# Patient Record
Sex: Male | Born: 1985 | Race: Black or African American | Hispanic: No | Marital: Single | Smoking: Current some day smoker
Health system: Southern US, Community
[De-identification: ages and names within clinical notes are randomized; demographics above are authoritative.]

---
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
## 2008-03-25 ENCOUNTER — Emergency Department (HOSPITAL_COMMUNITY): Admission: EM | Admit: 2008-03-25 | Discharge: 2008-03-25 | Payer: Self-pay | Admitting: Emergency Medicine

## 2008-12-04 ENCOUNTER — Telehealth: Payer: Self-pay | Admitting: *Deleted

## 2009-10-27 ENCOUNTER — Emergency Department (HOSPITAL_COMMUNITY): Admission: EM | Admit: 2009-10-27 | Discharge: 2009-10-27 | Payer: Self-pay | Admitting: Emergency Medicine
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
## 2013-09-15 ENCOUNTER — Emergency Department (HOSPITAL_COMMUNITY)
Admission: EM | Admit: 2013-09-15 | Discharge: 2013-09-15 | Disposition: A | Discharge status: Home / Self Care | Payer: Self-pay | Attending: Emergency Medicine | Admitting: Emergency Medicine

## 2013-09-15 ENCOUNTER — Encounter (HOSPITAL_COMMUNITY): Payer: Self-pay | Admitting: Emergency Medicine

## 2013-09-15 DIAGNOSIS — R3915 Urgency of urination: Secondary | ICD-10-CM | POA: Insufficient documentation

## 2013-09-15 DIAGNOSIS — Z79899 Other long term (current) drug therapy: Secondary | ICD-10-CM | POA: Insufficient documentation

## 2013-09-15 DIAGNOSIS — R3919 Other difficulties with micturition: Secondary | ICD-10-CM | POA: Insufficient documentation

## 2013-09-15 DIAGNOSIS — H00019 Hordeolum externum unspecified eye, unspecified eyelid: Secondary | ICD-10-CM | POA: Insufficient documentation

## 2013-09-15 DIAGNOSIS — R300 Dysuria: Secondary | ICD-10-CM | POA: Insufficient documentation

## 2013-09-15 DIAGNOSIS — F17200 Nicotine dependence, unspecified, uncomplicated: Secondary | ICD-10-CM | POA: Insufficient documentation

## 2013-09-15 LAB — URINALYSIS, ROUTINE W REFLEX MICROSCOPIC
BILIRUBIN URINE: NEGATIVE
GLUCOSE, UA: NEGATIVE mg/dL
HGB URINE DIPSTICK: NEGATIVE
Ketones, ur: NEGATIVE mg/dL
Leukocytes, UA: NEGATIVE
NITRITE: NEGATIVE
Protein, ur: NEGATIVE mg/dL
Specific Gravity, Urine: 1.036 — ABNORMAL HIGH (ref 1.005–1.030)
UROBILINOGEN UA: 1 mg/dL (ref 0.0–1.0)
pH: 6 (ref 5.0–8.0)

## 2013-09-15 MED ORDER — PHENAZOPYRIDINE HCL 200 MG PO TABS: 200.0000 mg | ORAL_TABLET | Freq: Three times a day (TID) | ORAL | Status: DC

## 2013-09-15 NOTE — ED Provider Notes (Signed)
CSN: [id number]     Arrival date & time 09/15/13  1850 History   First MD Initiated Contact with Patient 09/15/13 2056     Chief Complaint  Patient presents with  . Abdominal Pain   (Consider location/radiation/quality/duration/timing/severity/associated sxs/prior Treatment) HPI Comments: Patient is a 28 year old male presenting to the emergency department for 3 days of suprapubic discomfort with associated dysuria and urinary urgency. Patient states his urine has also been in dark amber color but denies any hematuria. He denies any alleviating factors. The patient states he has been drinking mostly sodas, has not had much water in the last week or so. Patient denies any fevers, chills, nausea, vomiting, diarrhea, constipation, rectal pain, back or flank pain, chest pain, shortness of breath, penile discharge, penile or testicular swelling. Patient denies any history of urinary tract infections. He denies any abdominal surgical history.    History reviewed. No pertinent past medical history. History reviewed. No pertinent past surgical history. History reviewed. No pertinent family history. History  Substance Use Topics  . Smoking status: Current Some Day Smoker -- 0.50 packs/day    Types: Cigarettes  . Smokeless tobacco: Not on file  . Alcohol Use: No    Review of Systems  Constitutional: Negative for fever and chills.  Gastrointestinal: Positive for abdominal pain. Negative for nausea, vomiting, diarrhea, constipation, blood in stool, abdominal distention, anal bleeding and rectal pain.  Genitourinary: Positive for dysuria, urgency and decreased urine volume. Negative for frequency, hematuria, flank pain, discharge, penile swelling, scrotal swelling, enuresis, difficulty urinating, genital sores, penile pain and testicular pain.  Musculoskeletal: Negative for back pain.  All other systems reviewed and are negative.    Allergies  Review of patient's allergies indicates no known  allergies.  Home Medications   Current Outpatient Rx  Name  Route  Sig  Dispense  Refill  . phenazopyridine (PYRIDIUM) 200 MG tablet   Oral   Take 1 tablet (200 mg total) by mouth 3 (three) times daily.   6 tablet   0    BP 148/82  Pulse 70  Temp(Src) 98 F (36.7 C) (Oral)  Resp 19  SpO2 96% Physical Exam  Constitutional: He is oriented to person, place, and time. He appears well-developed and well-nourished. No distress.  HENT:  Head: Normocephalic and atraumatic.  Right Ear: External ear normal.  Left Ear: External ear normal.  Nose: Nose normal.  Mouth/Throat: Oropharynx is clear and moist. No oropharyngeal exudate.  Eyes: Conjunctivae are normal. Right eye exhibits hordeolum (no drainage. ).  Neck: Neck supple.  Cardiovascular: Normal rate, regular rhythm and normal heart sounds.   Pulmonary/Chest: Effort normal and breath sounds normal.  Abdominal: Soft. Bowel sounds are normal. He exhibits no distension. There is tenderness in the suprapubic area. There is no rigidity, no rebound, no guarding and no CVA tenderness.  Genitourinary: Testes normal and penis normal. Uncircumcised. No phimosis, paraphimosis, hypospadias, penile erythema or penile tenderness. No discharge found.  Musculoskeletal: Normal range of motion. He exhibits no edema.       Thoracic back: Normal.       Lumbar back: Normal.  Lymphadenopathy:       Right: No inguinal adenopathy present.       Left: No inguinal adenopathy present.  Neurological: He is alert and oriented to person, place, and time.  Skin: Skin is warm and dry. He is not diaphoretic.    ED Course  Procedures (including critical care time) Labs Review Labs Reviewed  URINALYSIS, ROUTINE W REFLEX  MICROSCOPIC - Abnormal; Notable for the following:    Color, Urine AMBER (*)    Specific Gravity, Urine 1.036 (*)    All other components within normal limits  URINE CULTURE  GC/CHLAMYDIA PROBE AMP   Imaging Review No results  found.  EKG Interpretation   None       MDM   1. Dysuria     Filed Vitals:   09/15/13 2323  BP:   Pulse:   Temp: 98 F (36.7 C)  Resp:     Patient initially denies any concerns for STIs, upon telling patient he does not have a urinary tract infection he admits that he has had unprotected sexual intercourse with his partner who was tested at [facility] and found to have a negative result, he would now like to be tested.   Afebrile, NAD, non-toxic appearing, AAOx4.  Abdomen soft, mildly tender in suprapubic region, no distention. No peritoneal signs. GU exam unremarkable. UA reviewed, no indications of infection. Will send culture and treat if culture returns positive. GC/Chlamydia sent will inform patient in 48-72 hours if results are positive and he should return for treatment, will not prophylactically treat now given history, physical, and UA results. Return precautions discussed. Patient agreeable to plan. Patient stable at time of discharge. Patient d/w with Dr. Silverio Lay, agrees with plan.        Jeannetta Ellis, PA-C 09/15/13 2352

## 2013-09-15 NOTE — Discharge Instructions (Signed)
Please follow up with your primary care physician in 1-2 days. If you do not have one please call the [facility] number listed above. Please take Pyridium to help with urinary symptoms. Please use warm compresses to stye to help. A culture has been sent to check your urine for an infection. Please drink water and avoid sodas and caffeinated beverages. Please read all discharge instructions and return precautions.    Dysuria Dysuria is the medical term for pain with urination. There are many causes for dysuria, but urinary tract infection is the most common. If a urinalysis was performed it can show that there is a urinary tract infection. A urine culture confirms that you or your child is sick. You will need to follow up with a healthcare provider because:  If a urine culture was done you will need to know the culture results and treatment recommendations.  If the urine culture was positive, you or your child will need to be put on antibiotics or know if the antibiotics prescribed are the right antibiotics for your urinary tract infection.  If the urine culture is negative (no urinary tract infection), then other causes may need to be explored or antibiotics need to be stopped. Today laboratory work may have been done and there does not seem to be an infection. If cultures were done they will take at least 24 to 48 hours to be completed. Today x-rays may have been taken and they read as normal. No cause can be found for the problems. The x-rays may be re-read by a radiologist and you will be contacted if additional findings are made. You or your child may have been put on medications to help with this problem until you can see your primary caregiver. If the problems get better, see your primary caregiver if the problems return. If you were given antibiotics (medications which kill germs), take all of the mediations as directed for the full course of treatment.  If laboratory work  was done, you need to find the results. Leave a telephone number where you can be reached. If this is not possible, make sure you find out how you are to get test results. HOME CARE INSTRUCTIONS   Drink lots of fluids. For adults, drink eight, 8 ounce glasses of clear juice or water a day. For children, replace fluids as suggested by your caregiver.  Empty the bladder often. Avoid holding urine for long periods of time.  After a bowel movement, women should cleanse front to back, using each tissue only once.  Empty your bladder before and after sexual intercourse.  Take all the medicine given to you until it is gone. You may feel better in a few days, but TAKE ALL MEDICINE.  Avoid caffeine, tea, alcohol and carbonated beverages, because they tend to irritate the bladder.  In men, alcohol may irritate the prostate.  Only take over-the-counter or prescription medicines for pain, discomfort, or fever as directed by your caregiver.  If your caregiver has given you a follow-up appointment, it is very important to keep that appointment. Not keeping the appointment could result in a chronic or permanent injury, pain, and disability. If there is any problem keeping the appointment, you must call back to this facility for assistance. SEEK IMMEDIATE MEDICAL CARE IF:   Back pain develops.  A fever develops.  There is nausea (feeling sick to your stomach) or vomiting (throwing up).  Problems are no better with medications or are getting worse.  MAKE SURE YOU:   Understand these instructions.  Will watch your condition.  Will get help right away if you are not doing well or get worse. Document Released: 05/06/2004 Document Revised: 10/31/2011 Document Reviewed: 03/13/2008 [facility] Patient Information 2014 [place], [place]. Sexually Transmitted Disease A sexually transmitted disease (STD) is a disease or infection that may be passed (transmitted) from person to person, usually during sexual  activity. This may happen by way of saliva, semen, blood, vaginal mucus, or urine. Common STDs include:  Gonorrhea.  Chlamydia.  Syphilis.  HIV and AIDS.  Genital herpes.  Hepatitis B and C.  Trichomonas.  Human papillomavirus (HPV).  Pubic lice.  Scabies. Mites. Bacterial vaginosis. WHAT ARE CAUSES OF STDs? An STD may be caused by bacteria, a virus, or parasites. STDs are often transmitted during sexual activity if one person is infected. However, they may also be transmitted through nonsexual means. STDs may be transmitted after:  Sexual intercourse with an infected person.  Sharing sex toys with an infected person.  Sharing needles with an infected person or using unclean piercing or tattoo needles. Having intimate contact with the genitals, mouth, or rectal areas of an infected person.  Exposure to infected fluids during birth. WHAT ARE THE SIGNS AND SYMPTOMS OF STDs? Different STDs have different symptoms. Some people may not have any symptoms. If symptoms are present, they may include:  Painful or bloody urination.  Pain in the pelvis, abdomen, vagina, anus, throat, or eyes.  Skin rash, itching, irritation, growths, sores (lesions), ulcerations, or warts in the genital or anal area. Abnormal vaginal discharge with or without bad odor.  Penile discharge in men.  Fever.  Pain or bleeding during sexual intercourse.  Swollen glands in the groin area.  Yellow skin and eyes (jaundice). This is seen with hepatitis.  Swollen testicles. Infertility. Sores and blisters in the mouth. HOW ARE STDs DIAGNOSED? To make a diagnosis, your health care provider may:  Take a medical history.  Perform a physical exam.  Take a sample of any discharge for examination. Swab the throat, cervix, opening to the penis, rectum, or vagina for testing. Test a sample of your first morning urine.  Perform blood tests.  Perform a Pap smear, if this applies.  Perform a  colposcopy.  Perform a laparoscopy.  HOW ARE STDs TREATED? Treatment depends on the STD. Some STDs may be treated but not cured.  Chlamydia, gonorrhea, trichomonas, and syphilis can be cured with antibiotics.  Genital herpes, hepatitis, and HIV can be treated, but not cured, with prescribed medicines. The medicines lessen symptoms.  Genital warts from HPV can be treated with medicine or by freezing, burning (electrocautery), or surgery. Warts may come back.  HPV cannot be cured with medicine or surgery. However, abnormal areas may be removed from the cervix, vagina, or vulva.  If your diagnosis is confirmed, your recent sexual partners need treatment. This is true even if they are symptom-free or have a negative culture or evaluation. They should not have sex until their health care providers say it is OK. HOW CAN I REDUCE MY RISK OF GETTING AN STD? Use latex condoms, dental dams, and water-soluble lubricants during sexual activity. Do not use petroleum jelly or oils. Get vaccinated for HPV and hepatitis. If you have not received these vaccines in the past, talk to your health care provider about whether one or both might be right for you.  Avoid risky sex practices that can break the skin.  WHAT SHOULD I DO  IF I THINK I HAVE AN STD? See your health care provider.  Inform all sexual partners. They should be tested and treated for any STDs. Do not have sex until your health care provider says it is OK. WHEN SHOULD I GET HELP? Seek immediate medical care if: You develop severe abdominal pain. You are a man and notice swelling or pain in the testicles. You are a woman and notice swelling or pain in your vagina. Document Released: 10/29/2002 Document Revised: 05/29/2013 Document Reviewed: 02/26/2013 [facility] Patient Information 2014 [place], [place].

## 2013-09-15 NOTE — ED Notes (Signed)
Bed: [id number] Expected date:  Expected time:  Means of arrival:  Comments: Margo AyeHall A

## 2013-09-15 NOTE — ED Notes (Signed)
Pt arrived to the ED with a complaint of lower abdominal pain that has caused urinary discomfort.  Pt states pain has started three days ago and feels like a squeezing sensation.  Pt states urine started out amber, changes to a lighter color.  Pt took UTI OTC medication and had discoloration and pain.

## 2013-09-16 LAB — GC/CHLAMYDIA PROBE AMP
CT Probe RNA: NEGATIVE
GC Probe RNA: NEGATIVE

## 2013-09-17 LAB — URINE CULTURE
COLONY COUNT: NO GROWTH
Culture: NO GROWTH

## 2013-09-18 NOTE — ED Provider Notes (Signed)
Medical screening examination/treatment/procedure(s) were performed by non-physician practitioner and as supervising physician I was immediately available for consultation/collaboration.  EKG Interpretation   None         Richardean Canalavid H Shalini Mair, MD 09/18/13 [phone]

## 2014-02-02 ENCOUNTER — Encounter (HOSPITAL_COMMUNITY): Payer: Self-pay | Admitting: Emergency Medicine

## 2014-02-02 ENCOUNTER — Emergency Department (INDEPENDENT_AMBULATORY_CARE_PROVIDER_SITE_OTHER)
Admission: EM | Admit: 2014-02-02 | Discharge: 2014-02-02 | Disposition: A | Discharge status: Home / Self Care | Payer: Self-pay | Source: Home / Self Care | Attending: Emergency Medicine | Admitting: Emergency Medicine

## 2014-02-02 DIAGNOSIS — J029 Acute pharyngitis, unspecified: Secondary | ICD-10-CM

## 2014-02-02 LAB — POCT RAPID STREP A: STREPTOCOCCUS, GROUP A SCREEN (DIRECT): NEGATIVE

## 2014-02-02 MED ORDER — GUAIFENESIN-CODEINE 100-10 MG/5ML PO SYRP: 5.0000 mL | ORAL_SOLUTION | Freq: Four times a day (QID) | ORAL | Status: DC | PRN

## 2014-02-02 MED ORDER — PREDNISONE 20 MG PO TABS: 20.0000 mg | ORAL_TABLET | Freq: Two times a day (BID) | ORAL | Status: DC

## 2014-02-02 MED ORDER — AMOXICILLIN 500 MG PO CAPS: 1000.0000 mg | ORAL_CAPSULE | Freq: Three times a day (TID) | ORAL | Status: DC

## 2014-02-02 NOTE — ED Provider Notes (Signed)
Chief Complaint   Chief Complaint  Patient presents with  . Sore Throat    History of Present Illness   Cory Torres is a 28 year old male who has had a ten-day history of sore throat, hoarseness, chills, nasal congestion, rhinorrhea, ear congestion, a swollen gland on the right side of his neck, cough productive of small amounts of yellow sputum, and chest pain. He's had no GI symptoms. He's had several family members who've had bronchitis. He denies any fever or shortness of breath.  Review of Systems   Other than as noted above, the patient denies any of the following symptoms: Systemic:  No fevers, chills, sweats, or myalgias. Eye:  No redness or discharge. ENT:  No ear pain, headache, nasal congestion, drainage, sinus pressure, or sore throat. Neck:  No neck pain, stiffness, or swollen glands. Lungs:  No cough, sputum production, hemoptysis, wheezing, chest tightness, shortness of breath or chest pain. GI:  No abdominal pain, nausea, vomiting or diarrhea.  PMFSH   Past medical history, family history, social history, meds, and allergies were reviewed.   Physical exam   Vital signs:  BP 147/93  Pulse 76  Temp(Src) 98.7 F (37.1 C) (Oral)  Resp 18  SpO2 99% General:  Alert and oriented.  In no distress.  Skin warm and dry. Eye:  No conjunctival injection or drainage. Lids were normal. ENT:  TMs and canals were normal, without erythema or inflammation.  Nasal mucosa was clear and uncongested, without drainage.  Mucous membranes were moist.  Pharynx was clear with no exudate or drainage.  There were no oral ulcerations or lesions. Neck:  Supple, no adenopathy, tenderness or mass. Lungs:  No respiratory distress.  Lungs were clear to auscultation, without wheezes, rales or rhonchi.  Breath sounds were clear and equal bilaterally.  Heart:  Regular rhythm, without gallops, murmers or rubs. Skin:  Clear, warm, and dry, without rash or lesions.  Labs   Results for orders  placed during the hospital encounter of 02/02/14  POCT RAPID STREP A ([facility]RG CARE ONLY)      Result Value Ref Range   Streptococcus, Group A Screen (Direct) NEGATIVE  NEGATIVE    Assessment     The encounter diagnosis was Pharyngitis.  Plan    1.  Meds:  The following meds were prescribed:   Discharge Medication List as of 02/02/2014 11:16 AM    START taking these medications   Details  amoxicillin (AMOXIL) 500 MG capsule Take 2 capsules (1,000 mg total) by mouth 3 (three) times daily., Starting 02/02/2014, Until Discontinued, Normal    guaiFENesin-codeine (GUIATUSS AC) 100-10 MG/5ML syrup Take 5 mLs by mouth 4 (four) times daily as needed for cough., Starting 02/02/2014, Until Discontinued, Print    predniSONE (DELTASONE) 20 MG tablet Take 1 tablet (20 mg total) by mouth 2 (two) times daily., Starting 02/02/2014, Until Discontinued, Normal        2.  Patient Education/Counseling:  The patient was given appropriate handouts, self care instructions, and instructed in symptomatic relief.  Instructed to get extra fluids, rest, and use a cool mist vaporizer.    3.  Follow up:  The patient was told to follow up here if no better in 3 to 4 days, or sooner if becoming worse in any way, and given some red flag symptoms such as increasing fever, difficulty breathing, chest pain, or persistent vomiting which would prompt immediate return.  Follow up here as needed.      Cory Huaavid  Vivia Budge Jadalynn Burr, MD 02/02/14 1332

## 2014-02-02 NOTE — ED Notes (Signed)
Patient complains of sore throat with head congestion and drainage that is yellow in color; states some fever off and on for past 9 days.

## 2014-02-02 NOTE — Discharge Instructions (Signed)

## 2014-02-04 LAB — CULTURE, GROUP A STREP

## 2014-05-08 ENCOUNTER — Encounter (HOSPITAL_COMMUNITY): Payer: Self-pay | Admitting: Emergency Medicine

## 2014-05-08 ENCOUNTER — Emergency Department (HOSPITAL_COMMUNITY)
Admission: EM | Admit: 2014-05-08 | Discharge: 2014-05-08 | Disposition: A | Discharge status: Home Health | Payer: Self-pay | Attending: Emergency Medicine | Admitting: Emergency Medicine

## 2014-05-08 DIAGNOSIS — R1084 Generalized abdominal pain: Secondary | ICD-10-CM | POA: Insufficient documentation

## 2014-05-08 DIAGNOSIS — Y939 Activity, unspecified: Secondary | ICD-10-CM | POA: Insufficient documentation

## 2014-05-08 DIAGNOSIS — Y929 Unspecified place or not applicable: Secondary | ICD-10-CM | POA: Insufficient documentation

## 2014-05-08 DIAGNOSIS — T628X1A Toxic effect of other specified noxious substances eaten as food, accidental (unintentional), initial encounter: Secondary | ICD-10-CM | POA: Insufficient documentation

## 2014-05-08 DIAGNOSIS — F17200 Nicotine dependence, unspecified, uncomplicated: Secondary | ICD-10-CM | POA: Insufficient documentation

## 2014-05-08 DIAGNOSIS — A059 Bacterial foodborne intoxication, unspecified: Secondary | ICD-10-CM

## 2014-05-08 DIAGNOSIS — R112 Nausea with vomiting, unspecified: Secondary | ICD-10-CM | POA: Insufficient documentation

## 2014-05-08 LAB — BASIC METABOLIC PANEL
ANION GAP: 13 (ref 5–15)
BUN: 16 mg/dL (ref 6–23)
CO2: 22 mEq/L (ref 19–32)
Calcium: 9.3 mg/dL (ref 8.4–10.5)
Chloride: 101 mEq/L (ref 96–112)
Creatinine, Ser: 0.9 mg/dL (ref 0.50–1.35)
GFR calc Af Amer: >90 mL/min (ref >90)
GLUCOSE: 103 mg/dL — AB (ref 70–99)
POTASSIUM: 4.1 meq/L (ref 3.7–5.3)
SODIUM: 136 meq/L — AB (ref 137–147)

## 2014-05-08 LAB — CBC
HCT: 45.3 % (ref 39.0–52.0)
Hemoglobin: 15.9 g/dL (ref 13.0–17.0)
MCH: 32 pg (ref 26.0–34.0)
MCHC: 35.1 g/dL (ref 30.0–36.0)
MCV: 91.1 fL (ref 78.0–100.0)
PLATELETS: 225 10*3/uL (ref 150–400)
RBC: 4.97 MIL/uL (ref 4.22–5.81)
RDW: 12.7 % (ref 11.5–15.5)
WBC: 6.1 10*3/uL (ref 4.0–10.5)

## 2014-05-08 LAB — URINE MICROSCOPIC-ADD ON

## 2014-05-08 LAB — URINALYSIS, ROUTINE W REFLEX MICROSCOPIC
Bilirubin Urine: NEGATIVE
GLUCOSE, UA: NEGATIVE mg/dL
Hgb urine dipstick: NEGATIVE
Ketones, ur: NEGATIVE mg/dL
LEUKOCYTES UA: NEGATIVE
Nitrite: NEGATIVE
PROTEIN: 30 mg/dL — AB
Specific Gravity, Urine: 1.038 — ABNORMAL HIGH (ref 1.005–1.030)
UROBILINOGEN UA: 4 mg/dL — AB (ref 0.0–1.0)
pH: 6.5 (ref 5.0–8.0)

## 2014-05-08 MED ORDER — ONDANSETRON HCL 4 MG/2ML IJ SOLN
4.0000 mg | Freq: Once | INTRAMUSCULAR | Status: AC
  Administered 2014-05-08: 4 mg via INTRAVENOUS
  Filled 2014-05-08: qty 2

## 2014-05-08 MED ORDER — DICYCLOMINE HCL 10 MG/ML IM SOLN
20.0000 mg | Freq: Once | INTRAMUSCULAR | Status: AC
  Administered 2014-05-08: 20 mg via INTRAMUSCULAR
  Filled 2014-05-08: qty 2

## 2014-05-08 MED ORDER — OMEPRAZOLE 20 MG PO CPDR: 20.0000 mg | DELAYED_RELEASE_CAPSULE | Freq: Every day | ORAL | Status: DC

## 2014-05-08 MED ORDER — SODIUM CHLORIDE 0.9 % IV BOLUS (SEPSIS)
500.0000 mL | Freq: Once | INTRAVENOUS | Status: AC
Start: 2014-05-08 — End: 2014-05-08
  Administered 2014-05-08: 500 mL via INTRAVENOUS

## 2014-05-08 MED ORDER — ONDANSETRON 8 MG PO TBDP: ORAL_TABLET | ORAL | Status: DC

## 2014-05-08 MED ORDER — KETOROLAC TROMETHAMINE 30 MG/ML IJ SOLN
30.0000 mg | Freq: Once | INTRAMUSCULAR | Status: AC
  Administered 2014-05-08: 30 mg via INTRAVENOUS
  Filled 2014-05-08: qty 1

## 2014-05-08 MED ORDER — DICYCLOMINE HCL 20 MG PO TABS: 20.0000 mg | ORAL_TABLET | Freq: Two times a day (BID) | ORAL | Status: DC

## 2014-05-08 NOTE — ED Notes (Signed)
Pt brought in via ems. Pt c/o right and left lower abd pain. Pt states pain is like a cramping sensation. No radiation of pain. No rebound tenderness. Pt states every am he has some abd pain. Pt became worse last night and woke him up out of his sleep.

## 2014-05-08 NOTE — ED Provider Notes (Signed)
CSN: [id number]     Arrival date & time 05/08/14  0203 History   First MD Initiated Contact with Patient 05/08/14 [phone]     Chief Complaint  Patient presents with  . Abdominal Pain     (Consider location/radiation/quality/duration/timing/severity/associated sxs/prior Treatment) Patient is a 28 y.o. male presenting with abdominal pain. The history is provided by the patient.  Abdominal Pain Pain location:  Generalized Pain quality: cramping   Pain radiates to:  Does not radiate Pain severity:  Severe Onset quality:  Sudden Timing:  Constant Progression:  Unchanged Chronicity:  New Context: eating and suspicious food intake   Context comment:  Burger and chili at [facility]  Relieved by:  Nothing Worsened by:  Nothing tried Ineffective treatments:  None tried Associated symptoms: nausea and vomiting   Risk factors: not pregnant     History reviewed. No pertinent past medical history. History reviewed. No pertinent past surgical history. History reviewed. No pertinent family history. History  Substance Use Topics  . Smoking status: Current Some Day Smoker -- 0.50 packs/day    Types: Cigarettes  . Smokeless tobacco: Not on file  . Alcohol Use: No    Review of Systems  Gastrointestinal: Positive for nausea, vomiting and abdominal pain.  All other systems reviewed and are negative.     Allergies  Review of patient's allergies indicates no known allergies.  Home Medications   Prior to Admission medications   Not on File   BP 157/91  Pulse 101  Temp(Src) 99.1 F (37.3 C) (Oral)  Resp 18  SpO2 94% Physical Exam  Constitutional: He is oriented to person, place, and time. He appears well-developed and well-nourished. No distress.  HENT:  Head: Normocephalic and atraumatic.  Mouth/Throat: Oropharynx is clear and moist. No oropharyngeal exudate.  Eyes: Conjunctivae and EOM are normal. Pupils are equal, round, and reactive to light.  Neck: Normal range of motion. Neck  supple.  Cardiovascular: Normal rate, regular rhythm and intact distal pulses.   Pulmonary/Chest: Effort normal and breath sounds normal. He has no wheezes. He has no rales.  Abdominal: Soft. Bowel sounds are increased. There is no tenderness. There is no rebound, no guarding, no tenderness at McBurney's point and negative Murphy's sign.  Musculoskeletal: Normal range of motion.  Neurological: He is alert and oriented to person, place, and time.  Skin: Skin is warm and dry.  Psychiatric: He has a normal mood and affect.    ED Course  Procedures (including critical care time) Labs Review Labs Reviewed  BASIC METABOLIC PANEL - Abnormal; Notable for the following:    Sodium 136 (*)    Glucose, Bld 103 (*)    All other components within normal limits  CBC  URINALYSIS, ROUTINE W REFLEX MICROSCOPIC    Imaging Review No results found.   EKG Interpretation None      MDM   Final diagnoses:  None    Likely the chili and hamburger from [facility] feels markedly improved will d/c with medication.  Return for worsening symptoms    Siddh Vandeventer K Geraldean Walen-Rasch, MD 05/08/14 [phone]

## 2014-05-08 NOTE — Discharge Instructions (Signed)
Food Poisoning °Food poisoning is an illness caused by something you ate or drank. It usually lasts 1 to 2 days. Problems may be worse for people with low immune systems, the elderly, children and infants, and pregnant women. °HOME CARE °· Drink enough water and fluids to keep your pee (urine) clear or pale yellow. Drink small amounts often. °· Ask your doctor how to replace body fluid losses (rehydration). °· Avoid: °¨ Foods high in sugar. °¨ Alcohol. °¨ Bubbly (carbonated) drinks. °¨ Tobacco. °¨ Juice. °¨ Caffeine drinks. °¨ Very hot or cold fluids. °¨ Fatty, greasy foods. °¨ Eating too much at one time. °¨ Dairy products until 24 to 48 hours after watery poop (diarrhea) stops. °· You may eat foods with active cultures (probiotics). They can be found in some yogurts and supplements. °· Wash your hands well to avoid spreading the illness. °· Only take medicines as told by your doctor. Do not give aspirin to children. °· Ask your doctor if you should keep taking your regular medicines. °GET HELP RIGHT AWAY IF:  °· You have trouble breathing, swallowing, talking, or moving. °· You have blurry vision. °· You cannot keep fluids down. °· You pass out (faint) or almost pass out. °· Your eyes turn yellow. °· You keep throwing up (vomiting) or having watery poop. °· Belly (abdominal) pain starts, gets worse, or is just in one small spot (localizes). °· You have a fever. °· Your watery poop has blood in it. °· You feel very weak, dizzy, or thirsty. °· You do not pee for 8 hours. °MAKE SURE YOU:  °· Understand these instructions. °· Will watch your condition. °· Will get help right away if you are not doing well or get worse. °Document Released: 01/26/2010 Document Revised: 10/31/2011 Document Reviewed: 12/24/2010 °[employer]® Patient Information ©2015 [employer]. This information is not intended to replace advice given to you by your health care provider. Make sure you discuss any questions you have with your health care  provider. ° °
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
## 2014-05-08 NOTE — ED Notes (Signed)
ems gave pt total of 100 mcg of fentanyl iv.

## 2014-05-08 NOTE — ED Notes (Signed)
Pt ambulates well without assistance.  

## 2014-11-13 ENCOUNTER — Emergency Department (HOSPITAL_COMMUNITY)
Admission: EM | Admit: 2014-11-13 | Discharge: 2014-11-13 | Disposition: A | Discharge status: Home / Self Care | Payer: Self-pay | Attending: Emergency Medicine | Admitting: Emergency Medicine

## 2014-11-13 ENCOUNTER — Encounter (HOSPITAL_COMMUNITY): Payer: Self-pay | Admitting: *Deleted

## 2014-11-13 DIAGNOSIS — Z79899 Other long term (current) drug therapy: Secondary | ICD-10-CM | POA: Insufficient documentation

## 2014-11-13 DIAGNOSIS — L02511 Cutaneous abscess of right hand: Secondary | ICD-10-CM | POA: Insufficient documentation

## 2014-11-13 DIAGNOSIS — Z720 Tobacco use: Secondary | ICD-10-CM | POA: Insufficient documentation

## 2014-11-13 MED ORDER — IBUPROFEN 400 MG PO TABS
600.0000 mg | ORAL_TABLET | Freq: Once | ORAL | Status: AC
  Administered 2014-11-13: 600 mg via ORAL
  Filled 2014-11-13: qty 2

## 2014-11-13 MED ORDER — IBUPROFEN 600 MG PO TABS: 600.0000 mg | ORAL_TABLET | Freq: Three times a day (TID) | ORAL | Status: DC

## 2014-11-13 MED ORDER — LIDOCAINE HCL (PF) 1 % IJ SOLN
2.0000 mL | Freq: Once | INTRAMUSCULAR | Status: DC
Start: 2014-11-13 — End: 2014-11-13
  Filled 2014-11-13: qty 5

## 2014-11-13 MED ORDER — HYDROCODONE-ACETAMINOPHEN 5-325 MG PO TABS: 1.0000 | ORAL_TABLET | ORAL | Status: DC | PRN

## 2014-11-13 NOTE — ED Notes (Addendum)
Pt went to [facility]  Monday 11-10-14 for finger abscess. Pt is currently taking clindamycin for the infection of finger.

## 2014-11-13 NOTE — ED Provider Notes (Signed)
CSN: [id number]     Arrival date & time 11/13/14  1345 History  This chart was scribed for non-physician practitioner, Earley FavorGail Pacen Watford, NP, working with Purvis SheffieldForrest Harrison, MD by Charline BillsEssence Howell, ED Scribe. This patient was seen in room TR08C/TR08C and the patient's care was started at 2:26 PM.   Chief Complaint  Patient presents with  . Abscess   The history is provided by the patient. No language interpreter was used.   HPI Comments: Cory Torres is a 29 y.o. male who presents to the Emergency Department complaining of a tender abscess to his R middle finger for the past few days. [facility] for the same. Pt had the abscess lanced and was discharged with Clindamycin. He returns today for persistent pain that is exacerbated with movement and swelling to his finger. No known allergies.  No PCP.   History reviewed. No pertinent past medical history. History reviewed. No pertinent past surgical history. History reviewed. No pertinent family history. History  Substance Use Topics  . Smoking status: Current Some Day Smoker -- 0.50 packs/day    Types: Cigarettes  . Smokeless tobacco: Not on file  . Alcohol Use: No    Review of Systems  Musculoskeletal: Positive for joint swelling.  Skin:       + Abscess   Allergies  Review of patient's allergies indicates no known allergies.  Home Medications   Prior to Admission medications   Medication Sig Start Date End Date Taking? Authorizing Provider  dicyclomine (BENTYL) 20 MG tablet Take 1 tablet (20 mg total) by mouth 2 (two) times daily. 05/08/14   April Palumbo, MD  omeprazole (PRILOSEC) 20 MG capsule Take 1 capsule (20 mg total) by mouth daily. 05/08/14   April Palumbo, MD  ondansetron [facility](ZOFRAN ODT) 8 MG disintegrating tablet 8mg  ODT q8 hours prn nausea 05/08/14   April Palumbo, MD   BP 135/90 mmHg  Pulse 80  Temp(Src) 98.5 F (36.9 C)  Resp 16  Ht 5\' 7"  (1.702 m)  Wt 265 lb (120.203 kg)  BMI 41.50 kg/m2  SpO2  100% Physical Exam  Constitutional: He is oriented to person, place, and time. He appears well-developed and well-nourished. No distress.  HENT:  Head: Normocephalic and atraumatic.  Eyes: Conjunctivae and EOM are normal.  Neck: Neck supple. No tracheal deviation present.  Cardiovascular: Normal rate.   Pulmonary/Chest: Effort normal. No respiratory distress.  Musculoskeletal: Normal range of motion.  R middle finger: Fluctuance between PIP and MCP joint on palmar surface with previous puncture/ I&D wound visible without drainage. Good capillary refill distally.  Neurological: He is alert and oriented to person, place, and time.  Skin: Skin is warm and dry.  Psychiatric: He has a normal mood and affect. His behavior is normal.  Nursing note and vitals reviewed.  ED Course  Procedures (including critical care time) DIAGNOSTIC STUDIES: Oxygen Saturation is 100% on RA, normal by my interpretation.    COORDINATION OF CARE: 2:27 PM-Discussed treatment plan which includes I&D with pt at bedside and pt agreed to plan.   INCISION AND DRAINAGE PROCEDURE NOTE: Patient identification was confirmed and verbal consent was obtained. This procedure was performed by Earley FavorGail Ennis Heavner, NP at 3:50 PM. Site: R middle finger Sterile procedures observed Needle size: 27 Anesthetic used (type and amt): 2 cc of 1% lidocaine  Blade size: 11 Drainage: copious, purulent  Complexity: Complex Site anesthetized, incision made over site, wound drained and explored loculations, rinsed with copious amounts of  normal saline, wound packed with sterile gauze, covered with dry, sterile dressing.  Pt tolerated procedure well without complications. Instructions for care discussed verbally and pt provided with additional written instructions for homecare and f/u.  Labs Review Labs Reviewed - No data to display  Imaging Review No results found.  EKG Interpretation None     Patient's finger was drained.  Copious  amount of purulent drainage was instructed to soak his hand couple times a day for the next 2 days in a warm saline  Apply small amount of antibiotic ointment to the area twice a day for 3 days.  He's also been referred to hand surgery if he has recurrent problem, as is the second time this same area has been drained MDM   Final diagnoses:  None    I personally performed the services described in this documentation, which was scribed in my presence. The recorded information has been reviewed and is accurate.   Earley Favor, NP 11/13/14 1621  Purvis Sheffield, MD 11/14/14 1728

## 2014-11-13 NOTE — Discharge Instructions (Signed)
Abscess Care After An abscess (also called a boil or furuncle) is an infected area that contains a collection of pus. Signs and symptoms of an abscess include pain, tenderness, redness, or hardness, or you may feel a moveable soft area under your skin. An abscess can occur anywhere in the body. The infection may spread to surrounding tissues causing cellulitis. A cut (incision) by the surgeon was made over your abscess and the pus was drained out. Gauze may have been packed into the space to provide a drain that will allow the cavity to heal from the inside outwards. The boil may be painful for 5 to 7 days. Most people with a boil do not have high fevers. Your abscess, if seen early, may not have localized, and may not have been lanced. If not, another appointment may be required for this if it does not get better on its own or with medications. HOME CARE INSTRUCTIONS   Only take over-the-counter or prescription medicines for pain, discomfort, or fever as directed by your caregiver.  When you bathe, soak and then remove gauze or iodoform packs at least daily or as directed by your caregiver. You may then wash the wound gently with mild soapy water. Repack with gauze or do as your caregiver directs. SEEK IMMEDIATE MEDICAL CARE IF:   You develop increased pain, swelling, redness, drainage, or bleeding in the wound site.  You develop signs of generalized infection including muscle aches, chills, fever, or a general ill feeling.  An oral temperature above 102 F (38.9 C) develops, not controlled by medication. See your caregiver for a recheck if you develop any of the symptoms described above. If medications (antibiotics) were prescribed, take them as directed. Document Released: 02/24/2005 Document Revised: 10/31/2011 Document Reviewed: 10/22/2007 [facility] Patient Information 2015 [place]. This information is not intended to replace advice given to you by your health care provider. Make sure  you discuss any questions you have with your health care provider. Your abscess has been drained.  Please soak your hand in warm saline bath 3 or 4 times a day today and tomorrow.  Take ibuprofen on a regular basis.  Please move your hand as much as possible.  This will help with swelling as well as keeping it elevated.  I've also referred you to the hand surgeon if you continue to have pain and swelling at the site

## 2015-03-19 ENCOUNTER — Emergency Department (HOSPITAL_COMMUNITY)
Admission: EM | Admit: 2015-03-19 | Discharge: 2015-03-19 | Disposition: A | Discharge status: Home / Self Care | Payer: Self-pay | Attending: Emergency Medicine | Admitting: Emergency Medicine

## 2015-03-19 ENCOUNTER — Encounter (HOSPITAL_COMMUNITY): Payer: Self-pay | Admitting: Emergency Medicine

## 2015-03-19 DIAGNOSIS — Z720 Tobacco use: Secondary | ICD-10-CM | POA: Insufficient documentation

## 2015-03-19 DIAGNOSIS — Z791 Long term (current) use of non-steroidal anti-inflammatories (NSAID): Secondary | ICD-10-CM | POA: Insufficient documentation

## 2015-03-19 DIAGNOSIS — R61 Generalized hyperhidrosis: Secondary | ICD-10-CM | POA: Insufficient documentation

## 2015-03-19 DIAGNOSIS — Z79899 Other long term (current) drug therapy: Secondary | ICD-10-CM | POA: Insufficient documentation

## 2015-03-19 DIAGNOSIS — L02511 Cutaneous abscess of right hand: Secondary | ICD-10-CM | POA: Insufficient documentation

## 2015-03-19 MED ORDER — SULFAMETHOXAZOLE-TRIMETHOPRIM 800-160 MG PO TABS: 2.0000 | ORAL_TABLET | Freq: Two times a day (BID) | ORAL | Status: AC

## 2015-03-19 NOTE — ED Provider Notes (Signed)
CSN: [id number]     Arrival date & time 03/19/15  1027 History  This chart was scribed for non-physician practitioner, Santiago Glad, PA-C working with Nelva Nay, MD by Gwenyth Ober, ED scribe. This patient was seen in room TR05C/TR05C and the patient's care was started at 11:01 AM   Chief Complaint  Patient presents with  . foreign object in finger    The history is provided by the patient. No language interpreter was used.   HPI Comments: Cory Torres is a 29 y.o. male who presents to the Emergency Department complaining of a recurrent, gradually worsening area of swelling on his right middle finger that started 4 months ago, improved and then became worse in the last few weeks. Pt states orange purulent drainage, diaphoresis and a sharp, mild pain within the wound as associated symptoms. He tried warm water soaks and shaving and draining the wound with no relief. He also tried OTC wart remover which caused worsening swelling of the area. Pt has been evaluated by Urgent Care twice for the same who prescribed Clindamycin and performed I&D. He was last seen in the ED for the same on 3/24, was diagnosed with an abscess and was treated with I&D. Pt denies fever and chills as associated symptoms.  History reviewed. No pertinent past medical history. History reviewed. No pertinent past surgical history. No family history on file. History  Substance Use Topics  . Smoking status: Current Some Day Smoker -- 0.50 packs/day    Types: Cigarettes  . Smokeless tobacco: Not on file  . Alcohol Use: No    Review of Systems  Constitutional: Positive for diaphoresis. Negative for fever and chills.  Skin: Positive for wound.    Allergies  Review of patient's allergies indicates no known allergies.  Home Medications   Prior to Admission medications   Medication Sig Start Date End Date Taking? Authorizing Provider  dicyclomine (BENTYL) 20 MG tablet Take 1 tablet (20 mg total) by mouth 2  (two) times daily. 05/08/14   April Palumbo, MD  HYDROcodone-acetaminophen (NORCO/VICODIN) 5-325 MG per tablet Take 1 tablet by mouth every 4 (four) hours as needed. 11/13/14   Earley Favor, NP  ibuprofen (ADVIL,MOTRIN) 600 MG tablet Take 1 tablet (600 mg total) by mouth 3 (three) times daily. 11/13/14   Earley Favor, NP  omeprazole (PRILOSEC) 20 MG capsule Take 1 capsule (20 mg total) by mouth daily. 05/08/14   April Palumbo, MD  ondansetron [facility] ODT) 8 MG disintegrating tablet 8mg  ODT q8 hours prn nausea 05/08/14   April Palumbo, MD   BP 147/105 mmHg  Pulse 65  Temp(Src) 98.4 F (36.9 C) (Oral)  Resp 22  SpO2 100% Physical Exam  Constitutional: He appears well-developed and well-nourished. No distress.  HENT:  Head: Normocephalic and atraumatic.  Eyes: Conjunctivae and EOM are normal.  Neck: Neck supple. No tracheal deviation present.  Cardiovascular: Normal rate, regular rhythm and normal heart sounds.   Normal cap refill of right middle finger  Pulmonary/Chest: Effort normal and breath sounds normal. No respiratory distress.  Musculoskeletal:  Full ROM of the MCP, PIP and DIP of the right middle finger  Neurological:  Good sensation of right middle finger  Skin: Skin is warm and dry. No erythema.  1.5 cm hyperpigmented raised area of the palmar aspect of the right middle finger in between the MCP and PIP; no drainage; no fluctuance; no surrounding erythema, edema or warmth  Psychiatric: He has a normal mood and affect. His behavior is  normal.  Nursing note and vitals reviewed.   ED Course  Procedures   DIAGNOSTIC STUDIES: Oxygen Saturation is 100% on RA, normal by my interpretation.    COORDINATION OF CARE: 11:02 AM Discussed treatment plan with pt at bedside which includes antibiotic treatment. Pt agreed to plan.  Labs Review Labs Reviewed - No data to display  Imaging Review No results found.   EKG Interpretation None      MDM   Final diagnoses:  None  Patient  presents today with a raised area of his right middle finger.  No signs of infection at this time.  However, patient reports that he has noticed some drainage of the area.  Patient states that he does not want the area incised.  Area evaluated with bedside ultrasound.  No signs of fluid collection.  Patient neurovascularly intact.  Patient stable for discharge.  Due to the fact that this is recurrent will refer to Hand Surgery.  Return precautions given.    I personally performed the services described in this documentation, which was scribed in my presence. The recorded information has been reviewed and is accurate.    Santiago Glad, PA-C 03/20/15 2006  Nelva Nay, MD 03/21/15 [phone]

## 2015-03-19 NOTE — ED Notes (Signed)
Patient states he has been seen multiple times for the same thing.   Patient states he has "something in my middle finger on the R hand".   Patient states he "shaved off the top and squeezed it yesterday and got a lot of pus out".   Patient states he "has something sharp in there, I can feel it".   Denies pain.

## 2015-05-19 ENCOUNTER — Emergency Department (HOSPITAL_COMMUNITY)
Admission: EM | Admit: 2015-05-19 | Discharge: 2015-05-19 | Disposition: A | Discharge status: Home / Self Care | Payer: Self-pay | Attending: Emergency Medicine | Admitting: Emergency Medicine

## 2015-05-19 ENCOUNTER — Encounter (HOSPITAL_COMMUNITY): Payer: Self-pay | Admitting: Emergency Medicine

## 2015-05-19 DIAGNOSIS — L02511 Cutaneous abscess of right hand: Secondary | ICD-10-CM

## 2015-05-19 DIAGNOSIS — L03011 Cellulitis of right finger: Secondary | ICD-10-CM | POA: Insufficient documentation

## 2015-05-19 DIAGNOSIS — Z720 Tobacco use: Secondary | ICD-10-CM | POA: Insufficient documentation

## 2015-05-19 DIAGNOSIS — Z791 Long term (current) use of non-steroidal anti-inflammatories (NSAID): Secondary | ICD-10-CM | POA: Insufficient documentation

## 2015-05-19 DIAGNOSIS — Z79899 Other long term (current) drug therapy: Secondary | ICD-10-CM | POA: Insufficient documentation

## 2015-05-19 DIAGNOSIS — Z792 Long term (current) use of antibiotics: Secondary | ICD-10-CM | POA: Insufficient documentation

## 2015-05-19 MED ORDER — HYDROCODONE-ACETAMINOPHEN 5-325 MG PO TABS: 1.0000 | ORAL_TABLET | Freq: Four times a day (QID) | ORAL | Status: DC | PRN

## 2015-05-19 MED ORDER — LIDOCAINE HCL 2 % IJ SOLN
15.0000 mL | Freq: Once | INTRAMUSCULAR | Status: AC
  Administered 2015-05-19: 10 mg
  Filled 2015-05-19: qty 20

## 2015-05-19 MED ORDER — CEPHALEXIN 500 MG PO CAPS: 500.0000 mg | ORAL_CAPSULE | Freq: Two times a day (BID) | ORAL | Status: DC

## 2015-05-19 NOTE — ED Provider Notes (Signed)
CSN: [id number]     Arrival date & time 05/19/15  4010 History  By signing my name below, I, Tanda Rockers, attest that this documentation has been prepared under the direction and in the presence of Marlon Pel, PA-C. Electronically Signed: Tanda Rockers, ED Scribe. 05/19/2015. 10:26 AM.  Chief Complaint  Patient presents with  . Hand Problem   The history is provided by the patient. No language interpreter was used.     HPI Comments: Cory Torres is a 29 y.o. male with hx recurrent middle finger infection who presents to the Emergency Department complaining of gradual onset, constant, 10/10, right middle pain and swelling x 3 days. Pt reports similar symptoms twice in the past few months, and was seen at [facility] and the ED. Pt had I&D done last time but states no pus return. Pt soaked the finger 2 days after the I&D and had his father cut his finger with a razor and notes draining the finger himself. He did attempt to follow up with a hand specialist at that time but states it was too expensive. Pt mentions taking antibiotics the last two times which resolved his symptoms at the time. He denies finger, chills, or any other associated symptoms.     History reviewed. No pertinent past medical history. History reviewed. No pertinent past surgical history. No family history on file. Social History  Substance Use Topics  . Smoking status: Current Some Day Smoker -- 0.50 packs/day    Types: Cigarettes  . Smokeless tobacco: None  . Alcohol Use: No    Review of Systems  Constitutional: Negative for fever and chills.  Musculoskeletal: Positive for joint swelling and arthralgias (Right middle finger pain).  All other systems reviewed and are negative.  Allergies  Review of patient's allergies indicates no known allergies.  Home Medications   Prior to Admission medications   Medication Sig Start Date End Date Taking? Authorizing Provider  cephALEXin (KEFLEX) 500 MG capsule Take 1  capsule (500 mg total) by mouth 2 (two) times daily. 05/19/15   Tiffany Neva Seat, PA-C  dicyclomine (BENTYL) 20 MG tablet Take 1 tablet (20 mg total) by mouth 2 (two) times daily. 05/08/14   April Palumbo, MD  HYDROcodone-acetaminophen (NORCO/VICODIN) 5-325 MG per tablet Take 1 tablet by mouth every 6 (six) hours as needed. 05/19/15   Tiffany Neva Seat, PA-C  ibuprofen (ADVIL,MOTRIN) 600 MG tablet Take 1 tablet (600 mg total) by mouth 3 (three) times daily. 11/13/14   Earley Favor, NP  omeprazole (PRILOSEC) 20 MG capsule Take 1 capsule (20 mg total) by mouth daily. 05/08/14   April Palumbo, MD  ondansetron [facility] ODT) 8 MG disintegrating tablet  ODT q8 hours prn nausea 05/08/14   April Palumbo, MD   Triage Vitals: BP 149/95 mmHg  Pulse 79  Temp(Src) 97.9 F (36.6 C) (Oral)  Resp 16  Ht  (1.753 m)  Wt 260 lb (117.935 kg)  BMI 38.38 kg/m2  SpO2 100%   Physical Exam  Constitutional: He is oriented to person, place, and time. He appears well-developed and well-nourished. No distress.  HENT:  Head: Normocephalic and atraumatic.  Eyes: Conjunctivae and EOM are normal.  Neck: Neck supple. No tracheal deviation present.  Cardiovascular: Normal rate.   Pulmonary/Chest: Effort normal. No respiratory distress.  Musculoskeletal: Normal range of motion.  Swelling to the proximal phalanx with tenderness. FROM, no pain distally or proximally. CR < 2 seconds  Neurological: He is alert and oriented to person, place, and time.  Skin: Skin is warm and dry.  Psychiatric: He has a normal mood and affect. His behavior is normal.  Nursing note and vitals reviewed.   ED Course  Procedures (including critical care time)  DIAGNOSTIC STUDIES: Oxygen Saturation is 100% on RA, normal by my interpretation.    COORDINATION OF CARE: 10:23 AM-Discussed treatment plan which includes I&D, RX antibiotics, and referral to hand specialst with pt at bedside and pt agreed to plan.   Labs Review Labs Reviewed - No  data to display  Imaging Review No results found.    EKG Interpretation None      MDM   Final diagnoses:  Felon, right   NERVE BLOCK Performed by: Dorthula Matas Consent: Verbal consent obtained. Required items: required blood products, implants, devices, and special equipment available Time out: Immediately prior to procedure a "time out" was called to verify the correct patient, procedure, equipment, support staff and site/side marked as required.  Indication: middle finger right hand Nerve block body site: base of middle finger  Preparation: Patient was prepped and draped in the usual sterile fashion. Needle gauge: 24 G Location technique: anatomical landmarks  Local anesthetic: lidocaine  Anesthetic total: 3 ml  Outcome: pain improved Patient tolerance: Patient tolerated the procedure well with no immediate complications.   INCISION AND DRAINAGE Performed by: Dorthula Matas Consent: Verbal consent obtained. Risks and benefits: risks, benefits and alternatives were discussed Type: abscess  Body area: R middle finger  Complexity: complex Blunt dissection to break up loculations  Drainage: purulent  Drainage amount: large amount of purulent discharge.  Patient tolerance: Patient tolerated the procedure well with no immediate complications.  Rx: Keflex and Ultram, pt advised that he needs to see the hands surgeon or this abscess is likely to reoccur again until the core is removed.   Medications  lidocaine (XYLOCAINE) 2 % (with pres) injection 300 mg (10 mg Other Given 05/19/15 1037)    29 y.o.Cory Torres's evaluation in the Emergency Department is complete. It has been determined that no acute conditions requiring further emergency intervention are present at this time. The patient/guardian have been advised of the diagnosis and plan. We have discussed signs and symptoms that warrant return to the ED, such as changes or worsening in symptoms.  Vital  signs are stable at discharge. Filed Vitals:   05/19/15 0932  BP: 149/95  Pulse: 79  Temp: 97.9 F (36.6 C)  Resp: 16    Patient/guardian has voiced understanding and agreed to follow-up with the PCP or specialist.  I personally performed the services described in this documentation, which was scribed in my presence. The recorded information has been reviewed and is accurate.   Marlon Pel, PA-C 05/19/15 1111  Laurence Spates, MD 05/20/15 [phone]

## 2015-05-19 NOTE — ED Notes (Signed)
Pt has had recurrent right middle finger infection since May. States it has been "opened" twice, given abx with improvement. Started again 3 days ago. Right middle finger swollen.

## 2015-05-19 NOTE — Discharge Instructions (Signed)
Fingertip Infection °When an infection is around the nail, it is called a paronychia. When it appears over the tip of the finger, it is called a felon. These infections are due to minor injuries or cracks in the skin. If they are not treated properly, they can lead to bone infection and permanent damage to the fingernail. °Incision and drainage is necessary if a pus pocket (an abscess) has formed. Antibiotics and pain medicine may also be needed. Keep your hand elevated for the next 2-3 days to reduce swelling and pain. If a pack was placed in the abscess, it should be removed in 1-2 days by your caregiver. Soak the finger in warm water for 20 minutes 4 times daily to help promote drainage. °Keep the hands as dry as possible. Wear protective gloves with cotton liners. See your caregiver for follow-up care as recommended.  °HOME CARE INSTRUCTIONS  °· Keep wound clean, dry and dressed as suggested by your caregiver. °· Soak in warm salt water for fifteen minutes, four times per day for bacterial infections. °· Your caregiver will prescribe an antibiotic if a bacterial infection is suspected. Take antibiotics as directed and finish the prescription, even if the problem appears to be improving before the medicine is gone. °· Only take over-the-counter or prescription medicines for pain, discomfort, or fever as directed by your caregiver. °SEEK IMMEDIATE MEDICAL CARE IF: °· There is redness, swelling, or increasing pain in the wound. °· Pus or any other unusual drainage is coming from the wound. °· An unexplained oral temperature above 102° F (38.9° C) develops. °· You notice a foul smell coming from the wound or dressing. °MAKE SURE YOU:  °· Understand these instructions. °· Monitor your condition. °· Contact your caregiver if you are getting worse or not improving. °Document Released: 09/15/2004 Document Revised: 10/31/2011 Document Reviewed: 09/11/2008 °Exit[employer]® Patient Information ©2015 [employer]. This  information is not intended to replace advice given to you by your health care provider. Make sure you discuss any questions you have with your health care provider. ° °

## 2015-07-30 ENCOUNTER — Encounter (HOSPITAL_COMMUNITY): Payer: Self-pay | Admitting: Emergency Medicine

## 2015-07-30 ENCOUNTER — Emergency Department (HOSPITAL_COMMUNITY)
Admission: EM | Admit: 2015-07-30 | Discharge: 2015-07-30 | Disposition: A | Discharge status: Home / Self Care | Payer: Self-pay | Attending: Emergency Medicine | Admitting: Emergency Medicine

## 2015-07-30 DIAGNOSIS — Z791 Long term (current) use of non-steroidal anti-inflammatories (NSAID): Secondary | ICD-10-CM | POA: Insufficient documentation

## 2015-07-30 DIAGNOSIS — R197 Diarrhea, unspecified: Secondary | ICD-10-CM | POA: Insufficient documentation

## 2015-07-30 DIAGNOSIS — R112 Nausea with vomiting, unspecified: Secondary | ICD-10-CM | POA: Insufficient documentation

## 2015-07-30 DIAGNOSIS — F17210 Nicotine dependence, cigarettes, uncomplicated: Secondary | ICD-10-CM | POA: Insufficient documentation

## 2015-07-30 DIAGNOSIS — Z792 Long term (current) use of antibiotics: Secondary | ICD-10-CM | POA: Insufficient documentation

## 2015-07-30 DIAGNOSIS — Z79899 Other long term (current) drug therapy: Secondary | ICD-10-CM | POA: Insufficient documentation

## 2015-07-30 LAB — COMPREHENSIVE METABOLIC PANEL
ALBUMIN: 4.1 g/dL (ref 3.5–5.0)
ALK PHOS: 38 U/L (ref 38–126)
ALT: 30 U/L (ref 17–63)
ANION GAP: 6 (ref 5–15)
AST: 22 U/L (ref 15–41)
BUN: 15 mg/dL (ref 6–20)
CALCIUM: 8.4 mg/dL — AB (ref 8.9–10.3)
CO2: 26 mmol/L (ref 22–32)
Chloride: 106 mmol/L (ref 101–111)
Creatinine, Ser: 0.87 mg/dL (ref 0.61–1.24)
GFR calc Af Amer: >60 mL/min (ref >60)
GFR calc non Af Amer: >60 mL/min (ref >60)
GLUCOSE: 94 mg/dL (ref 65–99)
POTASSIUM: 3.8 mmol/L (ref 3.5–5.1)
SODIUM: 138 mmol/L (ref 135–145)
Total Bilirubin: 0.8 mg/dL (ref 0.3–1.2)
Total Protein: 7 g/dL (ref 6.5–8.1)

## 2015-07-30 LAB — URINALYSIS, ROUTINE W REFLEX MICROSCOPIC
Bilirubin Urine: NEGATIVE
Glucose, UA: NEGATIVE mg/dL
HGB URINE DIPSTICK: NEGATIVE
Ketones, ur: NEGATIVE mg/dL
Leukocytes, UA: NEGATIVE
Nitrite: NEGATIVE
Protein, ur: NEGATIVE mg/dL
SPECIFIC GRAVITY, URINE: 1.025 (ref 1.005–1.030)
pH: 6 (ref 5.0–8.0)

## 2015-07-30 LAB — CBC
HEMATOCRIT: 45.3 % (ref 39.0–52.0)
HEMOGLOBIN: 15.7 g/dL (ref 13.0–17.0)
MCH: 32 pg (ref 26.0–34.0)
MCHC: 34.7 g/dL (ref 30.0–36.0)
MCV: 92.4 fL (ref 78.0–100.0)
Platelets: 223 10*3/uL (ref 150–400)
RBC: 4.9 MIL/uL (ref 4.22–5.81)
RDW: 12.8 % (ref 11.5–15.5)
WBC: 8.1 10*3/uL (ref 4.0–10.5)

## 2015-07-30 LAB — LIPASE, BLOOD: Lipase: 35 U/L (ref 11–51)

## 2015-07-30 MED ORDER — LOPERAMIDE HCL 2 MG PO CAPS: 2.0000 mg | ORAL_CAPSULE | Freq: Four times a day (QID) | ORAL | Status: DC | PRN

## 2015-07-30 MED ORDER — ONDANSETRON HCL 4 MG/2ML IJ SOLN
4.0000 mg | Freq: Once | INTRAMUSCULAR | Status: AC
  Administered 2015-07-30: 4 mg via INTRAVENOUS
  Filled 2015-07-30: qty 2

## 2015-07-30 MED ORDER — DICYCLOMINE HCL 20 MG PO TABS: 20.0000 mg | ORAL_TABLET | Freq: Three times a day (TID) | ORAL | Status: DC

## 2015-07-30 MED ORDER — SODIUM CHLORIDE 0.9 % IV BOLUS (SEPSIS)
1000.0000 mL | Freq: Once | INTRAVENOUS | Status: AC
Start: 2015-07-30 — End: 2015-07-30
  Administered 2015-07-30: 1000 mL via INTRAVENOUS

## 2015-07-30 MED ORDER — LOPERAMIDE HCL 2 MG PO CAPS
2.0000 mg | ORAL_CAPSULE | Freq: Once | ORAL | Status: AC
  Administered 2015-07-30: 2 mg via ORAL
  Filled 2015-07-30: qty 1

## 2015-07-30 MED ORDER — DICYCLOMINE HCL 10 MG/ML IM SOLN
20.0000 mg | Freq: Once | INTRAMUSCULAR | Status: AC
  Administered 2015-07-30: 20 mg via INTRAMUSCULAR
  Filled 2015-07-30: qty 2

## 2015-07-30 MED ORDER — KETOROLAC TROMETHAMINE 30 MG/ML IJ SOLN
30.0000 mg | Freq: Once | INTRAMUSCULAR | Status: AC
  Administered 2015-07-30: 30 mg via INTRAVENOUS
  Filled 2015-07-30: qty 1

## 2015-07-30 MED ORDER — PROMETHAZINE HCL 25 MG PO TABS: 25.0000 mg | ORAL_TABLET | Freq: Four times a day (QID) | ORAL | Status: DC | PRN

## 2015-07-30 NOTE — ED Provider Notes (Signed)
TIME SEEN: 1:30 AM  CHIEF COMPLAINT: Abdominal cramps, nausea, vomiting and diarrhea  HPI: Pt is a 29 y.o. male with no past mental history who presents emergency department with one day of abdominal cramping, nausea vomiting diarrhea. Reports that he has had approximately 7-8 episodes of vomiting and 8 episodes of diarrhea tonight. Both nonbloody. No melena. No fevers, chills. No history of abdominal surgery. No dysuria, hematuria, penile discharge, testicular pain or swelling. No known sick contacts, recent travel, hospitalization or antibiotic use.  ROS: See HPI Constitutional: no fever  Eyes: no drainage  ENT: no runny nose   Cardiovascular:  no chest pain  Resp: no SOB  GI:  vomiting GU: no dysuria Integumentary: no rash  Allergy: no hives  Musculoskeletal: no leg swelling  Neurological: no slurred speech ROS otherwise negative  PAST MEDICAL HISTORY/PAST SURGICAL HISTORY:  History reviewed. No pertinent past medical history.  MEDICATIONS:  Prior to Admission medications   Medication Sig Start Date End Date Taking? Authorizing Provider  cephALEXin (KEFLEX) 500 MG capsule Take 1 capsule (500 mg total) by mouth 2 (two) times daily. 05/19/15   Tiffany Neva Seat, PA-C  dicyclomine (BENTYL) 20 MG tablet Take 1 tablet (20 mg total) by mouth 2 (two) times daily. 05/08/14   April Palumbo, MD  HYDROcodone-acetaminophen (NORCO/VICODIN) 5-325 MG per tablet Take 1 tablet by mouth every 6 (six) hours as needed. 05/19/15   Tiffany Neva Seat, PA-C  ibuprofen (ADVIL,MOTRIN) 600 MG tablet Take 1 tablet (600 mg total) by mouth 3 (three) times daily. 11/13/14   Earley Favor, NP  omeprazole (PRILOSEC) 20 MG capsule Take 1 capsule (20 mg total) by mouth daily. 05/08/14   April Palumbo, MD  ondansetron [facility] ODT) 8 MG disintegrating tablet  ODT q8 hours prn nausea 05/08/14   April Palumbo, MD    ALLERGIES:  No Known Allergies  SOCIAL HISTORY:  Social History  Substance Use Topics  . Smoking status:  Current Some Day Smoker -- 0.50 packs/day    Types: Cigarettes  . Smokeless tobacco: Not on file  . Alcohol Use: No    FAMILY HISTORY: History reviewed. No pertinent family history.  EXAM: BP 125/71 mmHg  Pulse 76  Temp(Src) 98.6 F (37 C) (Oral)  Resp 21  Ht  (1.702 m)  Wt 260 lb (117.935 kg)  BMI 40.71 kg/m2  SpO2 95% CONSTITUTIONAL: Alert and oriented and responds appropriately to questions. Well-appearing; well-nourished HEAD: Normocephalic EYES: Conjunctivae clear, PERRL ENT: normal nose; no rhinorrhea; moist mucous membranes; pharynx without lesions noted NECK: Supple, no meningismus, no LAD  CARD: RRR; S1 and S2 appreciated; no murmurs, no clicks, no rubs, no gallops RESP: Normal chest excursion without splinting or tachypnea; breath sounds clear and equal bilaterally; no wheezes, no rhonchi, no rales, no hypoxia or respiratory distress, speaking full sentences ABD/GI: Normal bowel sounds; non-distended; soft,  minimally tender throughout the abdomenno rebound, no guarding, no peritoneal signs, no significant tenderness at McBurney's point  BACK:  The back appears normal and is non-tender to palpation, there is no CVA tenderness EXT: Normal ROM in all joints; non-tender to palpation; no edema; normal capillary refill; no cyanosis, no calf tenderness or swelling    SKIN: Normal color for age and race; warm NEURO: Moves all extremities equally, sensation to light touch intact diffusely, cranial nerves II through XII intact PSYCH: The patient's mood and manner are appropriate. Grooming and personal hygiene are appropriate.  MEDICAL DECISION MAKING:  Patient here with likely viral illness. He is hemodynamically  stable and afebrile. Abdominal exam is benign. We'll obtain labs, urine and treat symptomatically with IV fluids, Toradol, Bentyl and Zofran. At this time I do not suspect that he has any surgical or emergent process. Doubt appendicitis, cholecystitis, pancreatitis,  colitis. Doubt bowel obstruction.  ED PROGRESS:  Pt's labs and urine are unremarkable. Serial abdominal exams remain benign. Patient reports feeling much better and has been able to drink without difficulty without further episodes of vomiting or diarrhea. We'll discharge home with prescriptions for Phenergan, Imodium and Bentyl to take as needed. Discussed with patient return precautions. We'll provide patient with outpatient follow-up information. Patient and significant other at bedside verbalize understanding and are comfortable with this plan.     Layla MawKristen N Angelys Yetman, DO 07/30/15 [phone]

## 2015-07-30 NOTE — ED Notes (Signed)
Patient states NVD X5 hours with lower abdominal pain

## 2015-07-30 NOTE — ED Notes (Signed)
Patient verbalizes understanding of discharge instructions, prescription medications, home care and follow up care if needed. Patient ambulatory out of department at this time with family. 

## 2015-07-30 NOTE — ED Notes (Signed)
Pt c/o lower abd pain for 3 hours.

## 2015-07-30 NOTE — Discharge Instructions (Signed)

## 2015-07-30 NOTE — ED Notes (Signed)
Patient able to tolerate PO fluids

## 2017-06-03 ENCOUNTER — Encounter (HOSPITAL_COMMUNITY): Payer: Self-pay | Admitting: *Deleted

## 2017-06-03 ENCOUNTER — Emergency Department (HOSPITAL_COMMUNITY)
Admission: EM | Admit: 2017-06-03 | Discharge: 2017-06-03 | Disposition: A | Discharge status: Home / Self Care | Payer: Self-pay | Attending: Emergency Medicine | Admitting: Emergency Medicine

## 2017-06-03 DIAGNOSIS — J039 Acute tonsillitis, unspecified: Secondary | ICD-10-CM | POA: Insufficient documentation

## 2017-06-03 DIAGNOSIS — F17210 Nicotine dependence, cigarettes, uncomplicated: Secondary | ICD-10-CM | POA: Insufficient documentation

## 2017-06-03 DIAGNOSIS — J029 Acute pharyngitis, unspecified: Secondary | ICD-10-CM

## 2017-06-03 LAB — RAPID STREP SCREEN (MED CTR MEBANE ONLY): Streptococcus, Group A Screen (Direct): NEGATIVE

## 2017-06-03 MED ORDER — DEXAMETHASONE SODIUM PHOSPHATE 10 MG/ML IJ SOLN
10.0000 mg | Freq: Once | INTRAMUSCULAR | Status: AC
  Administered 2017-06-03: 10 mg via INTRAMUSCULAR
  Filled 2017-06-03: qty 1

## 2017-06-03 NOTE — ED Triage Notes (Signed)
Pt reports having sore throat for over a week. Denies fever. Airway intact at triage.

## 2017-06-03 NOTE — ED Provider Notes (Signed)
[facility] Provider Note   CSN: [id number] date & time: 06/03/17  0941     History   Chief Complaint Chief Complaint  Patient presents with  . Sore Throat    HPI Cory Torres is a 31 y.o. male with a PMHx of obesity and allergic rhinitis, who presents to the ED with complaints of sore throat 10 days. He describes the pain as 10/10 constant soreness in the posterior throat, nonradiating, worse with swallowing, and unrelieved with TheraFlu and DayQuil however mildly improved with ibuprofen. He reports associated rhinorrhea and sinus congestion as well as postnasal drainage. He denies any known sick contacts, he is a nonsmoker. He denies any cough, drooling, trismus, ear pain or drainage, fevers, chills, CP, SOB, abd pain, N/V/D/C, hematuria, dysuria, myalgias, arthralgias, numbness, tingling, focal weakness, rashes, or any other complaints at this time.    The history is provided by the patient and medical records. No language interpreter was used.  Sore Throat  This is a new problem. The current episode started more than 1 week ago. The problem occurs constantly. The problem has not changed since onset.Pertinent negatives include no chest pain, no abdominal pain and no shortness of breath. Nothing aggravates the symptoms. Nothing relieves the symptoms. Treatments tried: dayquil, theraflu without relief; ibuprofen with minimal relief. The treatment provided mild relief.    History reviewed. No pertinent past medical history.  Patient Active Problem List   Diagnosis Date Noted  . OBESITY, NOS 10/19/2006  . RHINITIS, ALLERGIC 10/19/2006    History reviewed. No pertinent surgical history.     Home Medications    Prior to Admission medications   Medication Sig Start Date End Date Taking? Authorizing Provider  dicyclomine (BENTYL) 20 MG tablet Take 1 tablet (20 mg total) by mouth 3 (three) times daily before meals. As needed for abdominal cramps 07/30/15   Ward,  Layla Maw, DO  loperamide (IMODIUM) 2 MG capsule Take 1 capsule (2 mg total) by mouth 4 (four) times daily as needed for diarrhea or loose stools. 07/30/15   Ward, Layla Maw, DO  promethazine (PHENERGAN) 25 MG tablet Take 1 tablet (25 mg total) by mouth every 6 (six) hours as needed for nausea or vomiting. 07/30/15   Ward, Layla Maw, DO    Family History History reviewed. No pertinent family history.  Social History Social History  Substance Use Topics  . Smoking status: Current Some Day Smoker    Packs/day: 0.50    Types: Cigarettes  . Smokeless tobacco: Not on file  . Alcohol use No     Allergies   Patient has no known allergies.   Review of Systems Review of Systems  Constitutional: Negative for chills and fever.  HENT: Positive for congestion, rhinorrhea and sore throat. Negative for drooling, ear discharge, ear pain and trouble swallowing.   Respiratory: Negative for cough and shortness of breath.   Cardiovascular: Negative for chest pain.  Gastrointestinal: Negative for abdominal pain, constipation, diarrhea, nausea and vomiting.  Genitourinary: Negative for dysuria and hematuria.  Musculoskeletal: Negative for arthralgias and myalgias.  Skin: Negative for rash.  Allergic/Immunologic: Negative for immunocompromised state.  Neurological: Negative for weakness and numbness.  Psychiatric/Behavioral: Negative for confusion.   All other systems reviewed and are negative for acute change except as noted in the HPI.    Physical Exam Updated Vital Signs BP (!) 143/88 (BP Location: Right Arm)   Pulse 100   Temp 99.4 F (37.4 C) (Oral)   Resp 16  SpO2 100%   Physical Exam  Constitutional: He is oriented to person, place, and time. Vital signs are normal. He appears well-developed and well-nourished.  Non-toxic appearance. No distress.  Afebrile, nontoxic, NAD  HENT:  Head: Normocephalic and atraumatic.  Nose: Mucosal edema and rhinorrhea present.  Mouth/Throat: Uvula  is midline and mucous membranes are normal. No trismus in the jaw. No uvula swelling. Oropharyngeal exudate, posterior oropharyngeal edema and posterior oropharyngeal erythema present. No tonsillar abscesses. Tonsils are 1+ on the right. Tonsils are 1+ on the left. Tonsillar exudate.  Nose mildly congested and with slight rhinorrhea. Slight postnasal drainage noted. Oropharynx injected, without uvular swelling or deviation, no trismus or drooling, with 1+ bilateral tonsillar swelling and erythema, +exudates. No evidence of ludwig's or PTA.     Eyes: Conjunctivae and EOM are normal. Right eye exhibits no discharge. Left eye exhibits no discharge.  Neck: Normal range of motion. Neck supple.  Cardiovascular: Normal rate and intact distal pulses.   Pulmonary/Chest: Effort normal. No respiratory distress.  Abdominal: Normal appearance. He exhibits no distension.  Musculoskeletal: Normal range of motion.  Lymphadenopathy:    He has cervical adenopathy.  Shotty cervical LAD bilaterally which is mildly TTP  Neurological: He is alert and oriented to person, place, and time. He has normal strength. No sensory deficit.  Skin: Skin is warm, dry and intact. No rash noted.  Psychiatric: He has a normal mood and affect.  Nursing note and vitals reviewed.    ED Treatments / Results  Labs (all labs ordered are listed, but only abnormal results are displayed) Labs Reviewed  RAPID STREP SCREEN (NOT AT [facility])  CULTURE, GROUP A STREP [facility])    EKG  EKG Interpretation None       Radiology No results found.  Procedures Procedures (including critical care time)  Medications Ordered in ED Medications  dexamethasone (DECADRON) injection 10 mg (10 mg Intramuscular Given 06/03/17 1248)     Initial Impression / Assessment and Plan / ED Course  I have reviewed the triage vital signs and the nursing notes.  Pertinent labs & imaging results that were available during my care of the patient were  reviewed by me and considered in my medical decision making (see chart for details).     31 y.o. male here with sore throat x10 days with associated rhinorrhea and sinus congestion/postnasal drainage. On exam, 1+ bilateral tonsillar swelling with a few exudates, no evidence of PTA or ludwig's, handling secretions well, mild nasal congestion and postnasal drainage, shotty cervical LAD bilaterally which is mildly TTP. Afebrile and nontoxic. RST negative here, and given low-moderate CENTOR score, will trust this result. Will give decadron IM to help with tonsillar swelling and pain, advised OTC remedies for symptomatic relief, and f/up with PCP in 1wk. I explained the diagnosis and have given explicit precautions to return to the ER including for any other new or worsening symptoms. The patient understands and accepts the medical plan as it's been dictated and I have answered their questions. Discharge instructions concerning home care and prescriptions have been given. The patient is STABLE and is discharged to home in good condition.    Final Clinical Impressions(s) / ED Diagnoses   Final diagnoses:  Viral pharyngitis  Tonsillitis    New Prescriptions New Prescriptions   No medications on [address], [place], [place] 06/03/17 1257    Mancel Bale, MD 06/03/17 1600

## 2017-06-03 NOTE — Discharge Instructions (Signed)
Continue to stay well-hydrated. Gargle warm salt water and spit it out. Use chloraseptic spray as needed for sore throat. Continue to alternate between Tylenol and Ibuprofen for pain or fever. Use Mucinex for cough suppression/expectoration of mucus. Use netipot and flonase to help with nasal congestion. May consider over-the-counter Benadryl or other antihistamine to decrease secretions and for help with your symptoms. Followup with your primary care doctor in 5-7 days for recheck of ongoing symptoms. Return to emergency department for emergent changing or worsening of symptoms.  °

## 2017-06-04 LAB — CULTURE, GROUP A STREP (THRC)

## 2022-03-16 ENCOUNTER — Encounter (HOSPITAL_COMMUNITY): Payer: Self-pay

## 2022-03-16 ENCOUNTER — Ambulatory Visit (HOSPITAL_COMMUNITY)
Admission: EM | Admit: 2022-03-16 | Discharge: 2022-03-16 | Disposition: A | Discharge status: Home / Self Care | Payer: BC Managed Care – PPO | Attending: Internal Medicine | Admitting: Internal Medicine

## 2022-03-16 DIAGNOSIS — Z23 Encounter for immunization: Secondary | ICD-10-CM

## 2022-03-16 DIAGNOSIS — S60410A Abrasion of right index finger, initial encounter: Secondary | ICD-10-CM | POA: Diagnosis not present

## 2022-03-16 DIAGNOSIS — S60419A Abrasion of unspecified finger, initial encounter: Secondary | ICD-10-CM

## 2022-03-16 MED ORDER — BACITRACIN ZINC 500 UNIT/GM EX OINT: TOPICAL_OINTMENT | Freq: Two times a day (BID) | CUTANEOUS | Status: DC

## 2022-03-16 MED ORDER — IBUPROFEN 800 MG PO TABS
800.0000 mg | ORAL_TABLET | Freq: Once | ORAL | Status: AC
  Administered 2022-03-16: 800 mg via ORAL

## 2022-03-16 MED ORDER — MUPIROCIN 2 % EX OINT: 1.0000 | TOPICAL_OINTMENT | Freq: Two times a day (BID) | CUTANEOUS | 0 refills | Status: AC

## 2022-03-16 MED ORDER — TETANUS-DIPHTH-ACELL PERTUSSIS 5-2.5-18.5 LF-MCG/0.5 IM SUSY
0.5000 mL | PREFILLED_SYRINGE | Freq: Once | INTRAMUSCULAR | Status: AC
  Administered 2022-03-16: 0.5 mL via INTRAMUSCULAR

## 2022-03-16 MED ORDER — IBUPROFEN 800 MG PO TABS
ORAL_TABLET | ORAL | Status: AC
  Filled 2022-03-16: qty 1

## 2022-03-16 MED ORDER — BACITRACIN ZINC 500 UNIT/GM EX OINT
TOPICAL_OINTMENT | CUTANEOUS | Status: AC
  Filled 2022-03-16: qty 9

## 2022-03-16 MED ORDER — TETANUS-DIPHTH-ACELL PERTUSSIS 5-2.5-18.5 LF-MCG/0.5 IM SUSY
PREFILLED_SYRINGE | INTRAMUSCULAR | Status: AC
  Filled 2022-03-16: qty 0.5

## 2022-03-16 NOTE — ED Triage Notes (Signed)
Pt shaved the skin off his rt pointer finger while moving a engine at work.

## 2022-03-16 NOTE — ED Provider Notes (Signed)
[facility]    CSN: [id number] Arrival date & time: 03/16/22  1449      History   Chief Complaint Chief Complaint  Patient presents with   Laceration    HPI Cory Torres is a 36 y.o. male.   Patient presents to urgent care for evaluation of a laceration to the dorsal aspect of his right finger in between the DIP and PIP joints that he sustained this afternoon while at work.  Patient states that he was attempting to move an engine when the laceration happened.  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  He is able to manage his wounds most of the time at home but this one stings and burns quite a bit.  Unknown date of last tetanus shot.  Patient is reporting a significant stinging sensation to the right finger where the injury is.  He has not attempted use of any over-the-counter medications prior to arrival to urgent care.  He has full range of motion of his right hand and good blood flow to his finger.  No numbness or tingling to the bilateral upper extremities.  No pain to the hand or wrist joints.     History reviewed. No pertinent past medical history.  Patient Active Problem List   Diagnosis Date Noted   OBESITY, NOS 10/19/2006   RHINITIS, ALLERGIC 10/19/2006    History reviewed. No pertinent surgical history.     Home Medications    Prior to Admission medications   Medication Sig Start Date End Date Taking? Authorizing Provider  mupirocin ointment (BACTROBAN) 2 % Apply 1 Application topically 2 (two) times daily. 03/16/22  Yes Carlisle Beers, FNP  omeprazole (PRILOSEC) 20 MG capsule Take 1 capsule (20 mg total) by mouth daily. 05/08/14 07/30/15  Palumbo, April, MD    Family History History reviewed. No pertinent family history.  Social History Social History   Tobacco Use   Smoking status: Some Days    Packs/day: 0.50     Types: Cigarettes  Substance Use Topics   Alcohol use: No   Drug use: No     Allergies   Patient has no known allergies.   Review of Systems Review of Systems Per HPI  Physical Exam Triage Vital Signs ED Triage Vitals  Enc Vitals Group     BP 03/16/22 1518 (!) 146/89     Pulse Rate 03/16/22 1518 78     Resp 03/16/22 1518 18     Temp 03/16/22 1518 98.8 F (37.1 C)     Temp Source 03/16/22 1518 Oral     SpO2 03/16/22 1518 97 %     Weight --      Height --      Head Circumference --      Peak Flow --      Pain Score 03/16/22 1519 10     Pain Loc --      Pain Edu? --      Excl. in GC? --    No data found.  Updated Vital Signs BP (!) 146/89 (BP Location: Right Arm)   Pulse 78   Temp 98.8 F (37.1 C) (Oral)   Resp 18   SpO2 97%   Visual Acuity Right Eye Distance:   Left Eye Distance:   Bilateral Distance:    Right Eye Near:   Left Eye  Near:    Bilateral Near:     Physical Exam Vitals and nursing note reviewed.  Constitutional:      Appearance: Normal appearance. He is not ill-appearing or toxic-appearing.     Comments: Very pleasant patient sitting on exam in position of comfort table in no acute distress.   HENT:     Head: Normocephalic and atraumatic.     Right Ear: Hearing and external ear normal.     Left Ear: Hearing and external ear normal.     Nose: Nose normal.     Mouth/Throat:     Lips: Pink.     Mouth: Mucous membranes are moist.  Eyes:     General: Lids are normal. Vision grossly intact. Gaze aligned appropriately.     Extraocular Movements: Extraocular movements intact.     Conjunctiva/sclera: Conjunctivae normal.  Cardiovascular:     Heart sounds: S1 normal and S2 normal.  Pulmonary:     Effort: Pulmonary effort is normal.     Breath sounds: Normal air entry.  Abdominal:     Palpations: Abdomen is soft.  Musculoskeletal:     Cervical back: Neck supple.  Skin:    General: Skin is warm and dry.     Capillary Refill: Capillary  refill takes less than 2 seconds.     Findings: Abrasion present. No rash.     Comments: Abrasion present to the dorsal aspect of the right pointer finger.  See image below for further details.  Nail is intact.  Patient is able to make a fist with his right hand without significant difficulty although this does cause him to have worsening pain to the wound.  +2 radial pulses bilaterally.  Capillary refill distal to the wound is less than 3.  Sensation is intact distal to the wound.  Wound is not actively bleeding at this time and does not appear infected.  Neurological:     General: No focal deficit present.     Mental Status: He is alert and oriented to person, place, and time. Mental status is at baseline.     Cranial Nerves: No dysarthria or facial asymmetry.     Gait: Gait is intact.  Psychiatric:        Mood and Affect: Mood normal.        Speech: Speech normal.        Behavior: Behavior normal.        Thought Content: Thought content normal.        Judgment: Judgment normal.           UC Treatments / Results  Labs (all labs ordered are listed, but only abnormal results are displayed) Labs Reviewed - No data to display  EKG   Radiology No results found.  Procedures Procedures (including critical care time)  Medications Ordered in UC Medications  bacitracin ointment (has no administration in time range)  ibuprofen (ADVIL) tablet 800 mg (has no administration in time range)  Tdap (BOOSTRIX) injection 0.5 mL (0.5 mLs Intramuscular Given 03/16/22 1529)    Initial Impression / Assessment and Plan / UC Course  I have reviewed the triage vital signs and the nursing notes.  Pertinent labs & imaging results that were available during my care of the patient were reviewed by me and considered in my medical decision making (see chart for details).  1.  Abrasion of finger Wound cleansed and dressed in clinic with bacitracin ointment and nonstick gauze dressing.  Patient to  apply mupirocin ointment to  the wound and change the bandage twice daily for the next several days while the wound heals.  He has not a diabetic and is not at risk for delayed wound healing.  800 mg of ibuprofen given in the clinic for pain associated with the wound.  He may use 600 mg every 6 hours as needed with food at home of ibuprofen.  Next dose of ibuprofen may be tomorrow morning since he was given some in the clinic today.  Patient denies any allergies to medications or antibiotics.  No clinical indication for oral antibiotics at this time.  Patient instructed to return to the clinic if he notices worsening signs of infection/fever and chills.  Advised patient to keep wound clean and dry while at work.  Patient states that he wishes to return to work tomorrow.  Verbalizes understanding of need to wear gloves and keep the wound nice and clean/continue dressing changes even though he is working.  Patient is agreeable with this plan.   Discussed physical exam and available lab work findings in clinic with patient.  Counseled patient regarding appropriate use of medications and potential side effects for all medications recommended or prescribed today. Discussed red flag signs and symptoms of worsening condition,when to call the PCP office, return to urgent care, and when to seek higher level of care in the emergency department. Patient verbalizes understanding and agreement with plan. All questions answered. Patient discharged in stable condition.  Final Clinical Impressions(s) / UC Diagnoses   Final diagnoses:  Abrasion of finger, initial encounter     Discharge Instructions      Apply mupirocin ointment to your wound and change the bandage twice daily for the next several days while the wound heals.  Use nonstick gauze and tape to bandage the wound.  You may buy the supplies over-the-counter at the pharmacy or [facility].  At this time, we do not need oral antibiotics.  Keep the wound covered  while you are at work and wear gloves consistently.  If you notice worsening signs of infection like fever, chills, redness and swelling to the wound, or smelly drainage from the wound, please return to urgent care for reevaluation and possible antibiotics at that time.   You may take 600 mg of ibuprofen every 6 hours as needed for any pain or discomfort/swelling that you may experience at home.  Your next dose of ibuprofen may be tomorrow morning since I gave you some in the clinic today.  Take this medicine with food to avoid stomach upset.  Follow-up with urgent care as needed.  I suspect that this wound will heal well over time.    ED Prescriptions     Medication Sig Dispense Auth. Provider   mupirocin ointment (BACTROBAN) 2 % Apply 1 Application topically 2 (two) times daily. 22 g Carlisle Beers, FNP      PDMP not reviewed this encounter.   Carlisle Beers, [place] 03/16/22 1609

## 2022-03-16 NOTE — Discharge Instructions (Addendum)
Apply mupirocin ointment to your wound and change the bandage twice daily for the next several days while the wound heals.  Use nonstick gauze and tape to bandage the wound.  You may buy the supplies over-the-counter at the pharmacy or [facility].  At this time, we do not need oral antibiotics.  Keep the wound covered while you are at work and wear gloves consistently.  If you notice worsening signs of infection like fever, chills, redness and swelling to the wound, or smelly drainage from the wound, please return to urgent care for reevaluation and possible antibiotics at that time.   You may take 600 mg of ibuprofen every 6 hours as needed for any pain or discomfort/swelling that you may experience at home.  Your next dose of ibuprofen may be tomorrow morning since I gave you some in the clinic today.  Take this medicine with food to avoid stomach upset.  Follow-up with urgent care as needed.  I suspect that this wound will heal well over time.

## 2022-07-14 ENCOUNTER — Emergency Department (HOSPITAL_BASED_OUTPATIENT_CLINIC_OR_DEPARTMENT_OTHER): Payer: BC Managed Care – PPO

## 2022-07-14 ENCOUNTER — Emergency Department (HOSPITAL_BASED_OUTPATIENT_CLINIC_OR_DEPARTMENT_OTHER)
Admission: EM | Admit: 2022-07-14 | Discharge: 2022-07-15 | Disposition: A | Discharge status: Home / Self Care | Payer: BC Managed Care – PPO | Attending: Emergency Medicine | Admitting: Emergency Medicine

## 2022-07-14 ENCOUNTER — Encounter (HOSPITAL_BASED_OUTPATIENT_CLINIC_OR_DEPARTMENT_OTHER): Payer: Self-pay | Admitting: Emergency Medicine

## 2022-07-14 DIAGNOSIS — R112 Nausea with vomiting, unspecified: Secondary | ICD-10-CM | POA: Insufficient documentation

## 2022-07-14 DIAGNOSIS — R1084 Generalized abdominal pain: Secondary | ICD-10-CM | POA: Insufficient documentation

## 2022-07-14 DIAGNOSIS — R197 Diarrhea, unspecified: Secondary | ICD-10-CM | POA: Diagnosis not present

## 2022-07-14 LAB — URINALYSIS, ROUTINE W REFLEX MICROSCOPIC
Bilirubin Urine: NEGATIVE
Glucose, UA: NEGATIVE mg/dL
Hgb urine dipstick: NEGATIVE
Ketones, ur: NEGATIVE mg/dL
Leukocytes,Ua: NEGATIVE
Nitrite: NEGATIVE
Protein, ur: NEGATIVE mg/dL
Specific Gravity, Urine: 1.02 (ref 1.005–1.030)
pH: 7 (ref 5.0–8.0)

## 2022-07-14 LAB — CBC WITH DIFFERENTIAL/PLATELET
Abs Immature Granulocytes: 0.01 10*3/uL (ref 0.00–0.07)
Basophils Absolute: 0 10*3/uL (ref 0.0–0.1)
Basophils Relative: 0 %
Eosinophils Absolute: 0 10*3/uL (ref 0.0–0.5)
Eosinophils Relative: 0 %
HCT: 50.1 % (ref 39.0–52.0)
Hemoglobin: 17.1 g/dL — ABNORMAL HIGH (ref 13.0–17.0)
Immature Granulocytes: 0 %
Lymphocytes Relative: 10 %
Lymphs Abs: 0.7 10*3/uL (ref 0.7–4.0)
MCH: 31.1 pg (ref 26.0–34.0)
MCHC: 34.1 g/dL (ref 30.0–36.0)
MCV: 91.1 fL (ref 80.0–100.0)
Monocytes Absolute: 0.4 10*3/uL (ref 0.1–1.0)
Monocytes Relative: 5 %
Neutro Abs: 6.1 10*3/uL (ref 1.7–7.7)
Neutrophils Relative %: 85 %
Platelets: 262 10*3/uL (ref 150–400)
RBC: 5.5 MIL/uL (ref 4.22–5.81)
RDW: 12.5 % (ref 11.5–15.5)
WBC: 7.2 10*3/uL (ref 4.0–10.5)
nRBC: 0 % (ref 0.0–0.2)

## 2022-07-14 LAB — LIPASE, BLOOD: Lipase: 38 U/L (ref 11–51)

## 2022-07-14 LAB — COMPREHENSIVE METABOLIC PANEL
ALT: 22 U/L (ref 0–44)
AST: 27 U/L (ref 15–41)
Albumin: 4.3 g/dL (ref 3.5–5.0)
Alkaline Phosphatase: 39 U/L (ref 38–126)
Anion gap: 8 (ref 5–15)
BUN: 19 mg/dL (ref 6–20)
CO2: 24 mmol/L (ref 22–32)
Calcium: 8.7 mg/dL — ABNORMAL LOW (ref 8.9–10.3)
Chloride: 106 mmol/L (ref 98–111)
Creatinine, Ser: 0.97 mg/dL (ref 0.61–1.24)
GFR, Estimated: >60 mL/min (ref >60)
Glucose, Bld: 105 mg/dL — ABNORMAL HIGH (ref 70–99)
Potassium: 4.1 mmol/L (ref 3.5–5.1)
Sodium: 138 mmol/L (ref 135–145)
Total Bilirubin: 1.1 mg/dL (ref 0.3–1.2)
Total Protein: 7.5 g/dL (ref 6.5–8.1)

## 2022-07-14 MED ORDER — DROPERIDOL 2.5 MG/ML IJ SOLN
2.5000 mg | Freq: Once | INTRAMUSCULAR | Status: AC
  Administered 2022-07-14: 2.5 mg via INTRAVENOUS
  Filled 2022-07-14: qty 2

## 2022-07-14 MED ORDER — ONDANSETRON 4 MG PO TBDP: 4.0000 mg | ORAL_TABLET | Freq: Three times a day (TID) | ORAL | 0 refills | Status: AC | PRN

## 2022-07-14 MED ORDER — HYDROMORPHONE HCL 1 MG/ML IJ SOLN
1.0000 mg | Freq: Once | INTRAMUSCULAR | Status: AC
  Administered 2022-07-14: 1 mg via INTRAVENOUS
  Filled 2022-07-14: qty 1

## 2022-07-14 MED ORDER — IOHEXOL 300 MG/ML  SOLN
125.0000 mL | Freq: Once | INTRAMUSCULAR | Status: AC | PRN
  Administered 2022-07-14: 125 mL via INTRAVENOUS

## 2022-07-14 MED ORDER — SODIUM CHLORIDE 0.9 % IV BOLUS
1000.0000 mL | Freq: Once | INTRAVENOUS | Status: AC
  Administered 2022-07-14: 1000 mL via INTRAVENOUS

## 2022-07-14 MED ORDER — ONDANSETRON HCL 4 MG/2ML IJ SOLN
4.0000 mg | Freq: Once | INTRAMUSCULAR | Status: AC
  Administered 2022-07-14: 4 mg via INTRAVENOUS
  Filled 2022-07-14: qty 2

## 2022-07-14 MED ORDER — DICYCLOMINE HCL 20 MG PO TABS: 20.0000 mg | ORAL_TABLET | Freq: Two times a day (BID) | ORAL | 0 refills | Status: AC

## 2022-07-14 MED ORDER — MORPHINE SULFATE (PF) 4 MG/ML IV SOLN
4.0000 mg | Freq: Once | INTRAVENOUS | Status: AC
  Administered 2022-07-14: 4 mg via INTRAVENOUS
  Filled 2022-07-14: qty 1

## 2022-07-14 NOTE — Discharge Instructions (Signed)
It was a pleasure taking care of you today. As discussed your CT abdomen showed enteritis. I am sending you home with symptomatic treatment. Take as needed. Follow-up with PCP if symptoms do not improve over the next few days. Return to the ER for new or worsening symptoms.

## 2022-07-14 NOTE — ED Triage Notes (Addendum)
Per EMS, pt c/o abdominal pain and n/v that started this morning. States he ate [employer] station wings yesterday. Concerned for food poisoning. Pain is intermittent. Pt describes pain as "pulling" and severe.

## 2022-07-14 NOTE — ED Provider Notes (Signed)
[facility] EMERGENCY DEPARTMENT Provider Note   CSN: [id number] Arrival date & time: 07/14/22  1820     History  Chief Complaint  Patient presents with   Abdominal Pain   Emesis    Cory Torres is a 36 y.o. male with no significant past medical history presents to the ED due to diffuse abdominal pain associate with nausea vomiting that started around 10 AM this morning.  Patient admits to 5 episodes of nonbloody, nonbilious emesis today.  1 episode of nonbloody diarrhea.  No previous abdominal operations.  No urinary symptoms.  Admits to marijuana use.  Patient believes he has food poisoning because he had wings from a gas station around midnight last night.  No chest pain or shortness of breath.  HPI     Home Medications Prior to Admission medications   Medication Sig Start Date End Date Taking? Authorizing Provider  dicyclomine (BENTYL) 20 MG tablet Take 1 tablet (20 mg total) by mouth 2 (two) times daily. 07/14/22  Yes Cory Tischer C, PA-Torres  ondansetron (ZOFRAN-ODT) 4 MG disintegrating tablet Take 1 tablet (4 mg total) by mouth every 8 (eight) hours as needed for nausea or vomiting. 07/14/22  Yes Cory Torres, Cory Riches, PA-Torres  mupirocin ointment (BACTROBAN) 2 % Apply 1 Application topically 2 (two) times daily. 03/16/22   Cory Beers, Cory Torres  omeprazole (PRILOSEC) 20 MG capsule Take 1 capsule (20 mg total) by mouth daily. 05/08/14 07/30/15  Cory Torres, April, Cory Torres      Allergies    Patient has no known allergies.    Review of Systems   Review of Systems  Constitutional:  Negative for chills and fever.  Respiratory:  Negative for shortness of breath.   Cardiovascular:  Negative for chest pain.  Gastrointestinal:  Positive for abdominal pain, diarrhea, nausea and vomiting.  All other systems reviewed and are negative.   Physical Exam Updated Vital Signs BP 125/75 (BP Location: Right Arm)   Pulse 83   Temp 98.3 F (36.8 Torres) (Oral)   Resp 17   Ht 5' 7.5"  (1.715 m)   Wt 127 kg   SpO2 99%   BMI 43.21 kg/m  Physical Exam Vitals and nursing note reviewed.  Constitutional:      General: He is not in acute distress.    Appearance: He is not ill-appearing.  HENT:     Head: Normocephalic.  Eyes:     Pupils: Pupils are equal, round, and reactive to light.  Cardiovascular:     Rate and Rhythm: Normal rate and regular rhythm.     Pulses: Normal pulses.     Heart sounds: Normal heart sounds. No murmur heard.    No friction rub. No gallop.  Pulmonary:     Effort: Pulmonary effort is normal.     Breath sounds: Normal breath sounds.  Abdominal:     General: Abdomen is flat. There is no distension.     Palpations: Abdomen is soft.     Tenderness: There is abdominal tenderness. There is no guarding or rebound.     Comments: Diffuse tenderness with voluntary guarding.  Musculoskeletal:        General: Normal range of motion.     Cervical back: Neck supple.  Skin:    General: Skin is warm and dry.  Neurological:     General: No focal deficit present.     Mental Status: He is alert.  Psychiatric:        Mood and Affect: Mood  normal.        Behavior: Behavior normal.     ED Results / Procedures / Treatments   Labs (all labs ordered are listed, but only abnormal results are displayed) Labs Reviewed  COMPREHENSIVE METABOLIC PANEL - Abnormal; Notable for the following components:      Result Value   Glucose, Bld 105 (*)    Calcium 8.7 (*)    All other components within normal limits  CBC WITH DIFFERENTIAL/PLATELET - Abnormal; Notable for the following components:   Hemoglobin 17.1 (*)    All other components within normal limits  LIPASE, BLOOD  URINALYSIS, ROUTINE W REFLEX MICROSCOPIC    EKG None  Radiology CT ABDOMEN PELVIS W CONTRAST  Result Date: 07/14/2022 CLINICAL DATA:  Abdominal pain. EXAM: CT ABDOMEN AND PELVIS WITH CONTRAST TECHNIQUE: Multidetector CT imaging of the abdomen and pelvis was performed using the standard  protocol following bolus administration of intravenous contrast. RADIATION DOSE REDUCTION: This exam was performed according to the departmental dose-optimization program which includes automated exposure control, adjustment of the mA and/or kV according to patient size and/or use of iterative reconstruction technique. CONTRAST:  OMNIPAQUE IOHEXOL 300 MG/ML  SOLN COMPARISON:  None Available. FINDINGS: Lower chest: The lung bases are clear of acute process. No pleural effusion or pulmonary lesions. The heart is normal in size. No pericardial effusion. The distal esophagus and aorta are unremarkable. Hepatobiliary: 2.7 cm segment 5 liver lesion with peripheral nodular enhancement consistent with a benign hepatic hemangioma. No worrisome hepatic lesions or intrahepatic biliary dilatation. The gallbladder is unremarkable. No common bile duct dilatation. Pancreas: Normal Spleen: Normal Adrenals/Urinary Tract: Normal Stomach/Bowel: The stomach and duodenum are unremarkable. There are fluid-filled loops of mid distal small bowel but no significant distension. Mild mucosal enhancement could suggest enteritis. There is also moderate fluid in the cecum and ascending colon the remainder of the colon is unremarkable. The appendix is normal. Vascular/Lymphatic: The aorta is normal in caliber. No dissection. The branch vessels are patent. The major venous structures are patent. No mesenteric or retroperitoneal mass or adenopathy. Small scattered lymph nodes are noted. Reproductive: The prostate gland and seminal vesicles are unremarkable. Other: No pelvic mass or adenopathy. No free pelvic fluid collections. No inguinal mass or adenopathy. No abdominal wall hernia or subcutaneous lesions. Musculoskeletal: No significant bony findings. IMPRESSION: 1. Fluid-filled loops of mid distal small bowel and cecum with mild mucosal enhancement could suggest enteritis. 2. 2.7 cm segment 5 liver lesion with peripheral nodular  enhancement consistent with a benign hepatic hemangioma. Electronically Signed   By: Cory Torres M.D.   On: 07/14/2022 20:20    Procedures Procedures    Medications Ordered in ED Medications  sodium chloride 0.9 % bolus 1,000 mL (0 mLs Intravenous Stopped 07/14/22 2013)  morphine (PF) 4 MG/ML injection 4 mg (4 mg Intravenous Given 07/14/22 1850)  ondansetron (ZOFRAN) injection 4 mg (4 mg Intravenous Given 07/14/22 1850)  iohexol (OMNIPAQUE) 300 MG/ML solution 125 mL (125 mLs Intravenous Contrast Given 07/14/22 1922)  HYDROmorphone (DILAUDID) injection 1 mg (1 mg Intravenous Given 07/14/22 2105)  droperidol (INAPSINE) 2.5 MG/ML injection 2.5 mg (2.5 mg Intravenous Given 07/14/22 2221)    ED Course/ Medical Decision Making/ A&P                           Medical Decision Making Amount and/or Complexity of Data Reviewed Independent Historian: friend Labs: ordered. Decision-making details documented in ED Course. Radiology: ordered  and independent interpretation performed. Decision-making details documented in ED Course.  Risk Prescription drug management.   This patient presents to the ED for concern of N/V/D and abdominal pain, this involves an extensive number of treatment options, and is a complaint that carries with it a high risk of complications and morbidity.  The differential diagnosis includes diverticulitis, appendicitis, acute cholecystitis, bowel obstruction, gastroenteritis, etc  36 year old male presents to the ED due to nausea, vomiting, and diarrhea that started early this morning associated with abdominal pain.  No previous abdominal operations.  Patient admits to eating gas station wings around midnight last night.  Patient admits to marijuana use.  Upon arrival, vitals all within normal limits.  Patient is afebrile, not tachycardic or hypoxic.  Patient in no acute distress.  Physical exam significant for diffuse abdominal tenderness with voluntary guarding.  Abdominal  labs ordered.  CT abdomen ordered.  IV fluids, morphine, and Zofran given.  CBC reassuring.  No leukocytosis.  CMP reassuring.  Normal renal function.  No major electrolyte derangements.  Lipase normal at 38.  Doubt pancreatitis.  UA unremarkable.  No evidence of infection.  CT abdomen personally reviewed and interpreted which demonstrates enteritis.  No evidence of bowel obstruction, appendicitis, acute cholecystitis, or other emergent etiologies of abdominal pain.   9:36 PM reassessed patient at bedside.  Patient is to continued nausea.  Will give dose of droperidol and reassess.  11:17 PM reassessed patient at bedside.  Patient admits to improvement in symptoms after droperidol.  Patient able to tolerate p.o. at bedside.  Patient discharged with Zofran and Bentyl. Strict ED precautions discussed with patient. Patient states understanding and agrees to plan. Patient discharged home in no acute distress and stable vitals       Final Clinical Impression(s) / ED Diagnoses Final diagnoses:  Nausea vomiting and diarrhea  Generalized abdominal pain    Rx / DC Orders ED Discharge Orders          Ordered    ondansetron (ZOFRAN-ODT) 4 MG disintegrating tablet  Every 8 hours PRN        07/14/22 2131    dicyclomine (BENTYL) 20 MG tablet  2 times daily        07/14/22 2131              Jesusita Oka 07/14/22 2319    Charlynne Pander, Cory Torres 07/15/22 [phone]

## 2022-07-14 NOTE — ED Notes (Signed)
Patient transported to CT 

## 2022-07-14 NOTE — ED Notes (Addendum)
Unable to collect urine sample. Pt unable to void at this time.

## 2022-07-24 ENCOUNTER — Ambulatory Visit (HOSPITAL_COMMUNITY): Payer: Self-pay

## 2023-02-18 ENCOUNTER — Other Ambulatory Visit: Payer: Self-pay

## 2023-02-18 ENCOUNTER — Encounter (HOSPITAL_BASED_OUTPATIENT_CLINIC_OR_DEPARTMENT_OTHER): Payer: Self-pay | Admitting: Emergency Medicine

## 2023-02-18 ENCOUNTER — Emergency Department (HOSPITAL_BASED_OUTPATIENT_CLINIC_OR_DEPARTMENT_OTHER)
Admission: EM | Admit: 2023-02-18 | Discharge: 2023-02-19 | Disposition: A | Discharge status: Home / Self Care | Payer: BC Managed Care – PPO | Attending: Emergency Medicine | Admitting: Emergency Medicine

## 2023-02-18 DIAGNOSIS — H9391 Unspecified disorder of right ear: Secondary | ICD-10-CM | POA: Diagnosis present

## 2023-02-18 DIAGNOSIS — H60391 Other infective otitis externa, right ear: Secondary | ICD-10-CM

## 2023-02-18 DIAGNOSIS — H6691 Otitis media, unspecified, right ear: Secondary | ICD-10-CM | POA: Insufficient documentation

## 2023-02-18 MED ORDER — DOXYCYCLINE HYCLATE 100 MG PO CAPS: 100.0000 mg | ORAL_CAPSULE | Freq: Two times a day (BID) | ORAL | 0 refills | Status: AC

## 2023-02-18 MED ORDER — DOXYCYCLINE HYCLATE 100 MG PO TABS
100.0000 mg | ORAL_TABLET | Freq: Once | ORAL | Status: AC
  Administered 2023-02-19: 100 mg via ORAL
  Filled 2023-02-18: qty 1

## 2023-02-18 NOTE — ED Triage Notes (Signed)
Red bump on right ear lobe x 10 days to 2 weeks. Itchy and sore. And sweating

## 2023-02-18 NOTE — ED Provider Notes (Signed)
[place] EMERGENCY DEPARTMENT AT [facility] Provider Note   CSN: [id number] Arrival date & time: 02/18/23  2239     History  Chief Complaint  Patient presents with   Insect Bite    Cory Torres is a 37 y.o. male with no significant past medical history who presents to the ED due to infection to right ear.  Patient notes he noticed swelling and erythema to right earlobe roughly 2 weeks ago.  Edema has slightly improved however, is still present.  He admits to white drainage from ear a few days ago.  No fever.  Denies any recent injury to ear.  No hearing changes.  History obtained from patient and past medical records. No interpreter used during encounter.       Home Medications Prior to Admission medications   Medication Sig Start Date End Date Taking? Authorizing Provider  doxycycline (VIBRAMYCIN) 100 MG capsule Take 1 capsule (100 mg total) by mouth 2 (two) times daily. 02/18/23  Yes Rene Sizelove, [name], PA-C  dicyclomine (BENTYL) 20 MG tablet Take 1 tablet (20 mg total) by mouth 2 (two) times daily. 07/14/22   Mannie Stabile, PA-C  mupirocin ointment (BACTROBAN) 2 % Apply 1 Application topically 2 (two) times daily. 03/16/22   Carlisle Beers, FNP  ondansetron (ZOFRAN-ODT) 4 MG disintegrating tablet Take 1 tablet (4 mg total) by mouth every 8 (eight) hours as needed for nausea or vomiting. 07/14/22   Mannie Stabile, PA-C  omeprazole (PRILOSEC) 20 MG capsule Take 1 capsule (20 mg total) by mouth daily. 05/08/14 07/30/15  Palumbo, April, MD      Allergies    Patient has no known allergies.    Review of Systems   Review of Systems  HENT:  Positive for ear discharge and ear pain.     Physical Exam Updated Vital Signs BP (!) 130/92 (BP Location: Right Arm)   Pulse 81   Temp 98.3 F (36.8 C) (Oral)   Resp 16   SpO2 98%  Physical Exam Vitals and nursing note reviewed.  Constitutional:      General: He is not in acute distress.    Appearance:  He is not ill-appearing.  HENT:     Head: Normocephalic.     Ears:     Comments: Right ear lobe with erythema and edema. Small area induration to ear lobe. No fluctuance. No drainage.  Eyes:     Pupils: Pupils are equal, round, and reactive to light.  Cardiovascular:     Rate and Rhythm: Normal rate and regular rhythm.     Pulses: Normal pulses.     Heart sounds: Normal heart sounds. No murmur heard.    No friction rub. No gallop.  Pulmonary:     Effort: Pulmonary effort is normal.     Breath sounds: Normal breath sounds.  Abdominal:     General: Abdomen is flat. There is no distension.     Palpations: Abdomen is soft.     Tenderness: There is no abdominal tenderness. There is no guarding or rebound.  Musculoskeletal:        General: Normal range of motion.     Cervical back: Neck supple.  Skin:    General: Skin is warm and dry.  Neurological:     General: No focal deficit present.     Mental Status: He is alert.  Psychiatric:        Mood and Affect: Mood normal.  Behavior: Behavior normal.     ED Results / Procedures / Treatments   Labs (all labs ordered are listed, but only abnormal results are displayed) Labs Reviewed - No data to display  EKG None  Radiology No results found.  Procedures Procedures    Medications Ordered in ED Medications  doxycycline (VIBRA-TABS) tablet 100 mg (has no administration in time range)    ED Course/ Medical Decision Making/ A&P                             Medical Decision Making Risk Prescription drug management.   37 year old male presents to the ED due to erythema and edema to right earlobe x 2 weeks.  No injury.  On exam patient's right earlobe has mild erythema and edema.  Small area of induration.  No fluctuance.  No drainage.  Removed patient's earring from right ear.  Suspect symptoms related to cellulitis.  No drainable abscess on exam.  Patient treated with doxycycline here in the ED and discharged with  same. Strict ED precautions discussed with patient. Patient states understanding and agrees to plan. Patient discharged home in no acute distress and stable vitals  Lives at home No PCP       Final Clinical Impression(s) / ED Diagnoses Final diagnoses:  Infection of right ear lobe    Rx / DC Orders ED Discharge Orders          Ordered    doxycycline (VIBRAMYCIN) 100 MG capsule  2 times daily        02/18/23 2354              Mannie Stabile, PA-C 02/18/23 2356    Glynn Octave, MD 02/19/23 [phone]

## 2023-02-18 NOTE — Discharge Instructions (Signed)
It was a pleasure taking care of you today.  As discussed, I suspect you have an infection in your right earlobe.  I am sending you home with an antibiotic.  Take as prescribed and finish all antibiotics.  I have included the number of [facility].  Call to schedule an appointment to establish care. Return to the ER for new or worsening symptoms.

## 2023-02-19 NOTE — ED Notes (Signed)
Pt discharged to home, NAD noted 

## 2023-06-29 ENCOUNTER — Encounter (HOSPITAL_BASED_OUTPATIENT_CLINIC_OR_DEPARTMENT_OTHER): Payer: Self-pay

## 2023-06-29 ENCOUNTER — Emergency Department (HOSPITAL_BASED_OUTPATIENT_CLINIC_OR_DEPARTMENT_OTHER)
Admission: EM | Admit: 2023-06-29 | Discharge: 2023-06-29 | Disposition: A | Discharge status: Home / Self Care | Payer: BC Managed Care – PPO | Attending: Emergency Medicine | Admitting: Emergency Medicine

## 2023-06-29 ENCOUNTER — Other Ambulatory Visit: Payer: Self-pay

## 2023-06-29 ENCOUNTER — Other Ambulatory Visit (HOSPITAL_BASED_OUTPATIENT_CLINIC_OR_DEPARTMENT_OTHER): Payer: Self-pay

## 2023-06-29 DIAGNOSIS — R109 Unspecified abdominal pain: Secondary | ICD-10-CM | POA: Insufficient documentation

## 2023-06-29 DIAGNOSIS — R197 Diarrhea, unspecified: Secondary | ICD-10-CM | POA: Insufficient documentation

## 2023-06-29 MED ORDER — ONDANSETRON HCL 4 MG PO TABS
4.0000 mg | ORAL_TABLET | Freq: Four times a day (QID) | ORAL | 0 refills | Status: AC
  Filled 2023-06-29: qty 12, 3d supply, fill #0

## 2023-06-29 MED ORDER — CIPROFLOXACIN HCL 500 MG PO TABS
500.0000 mg | ORAL_TABLET | Freq: Two times a day (BID) | ORAL | 0 refills | Status: AC
  Filled 2023-06-29: qty 10, 5d supply, fill #0

## 2023-06-29 MED ORDER — LOPERAMIDE HCL 2 MG PO TABS
2.0000 mg | ORAL_TABLET | Freq: Four times a day (QID) | ORAL | 0 refills | Status: AC | PRN
  Filled 2023-06-29: qty 24, 6d supply, fill #0

## 2023-06-29 NOTE — ED Provider Notes (Signed)
[place] EMERGENCY DEPARTMENT AT [facility] Provider Note   CSN: [id number] Arrival date & time: 06/29/23  4098     History Chief Complaint  Patient presents with   Diarrhea    a   Abdominal Pain    HPI Cory Torres is a 37 y.o. male presenting for 6 days of profuse diarrhea nonbloody.  States he is having approximately 40 episodes per day make it difficult to work or function at all.  He has been trying to hydrate aggressively but the symptoms are persistent.  States it started after eating a lukewarm chicken salad from a [employer]. Symptoms not improving.  Otherwise ambulatory tolerating p.o. intake..   Patient's recorded medical, surgical, social, medication list and allergies were reviewed in the Snapshot window as part of the initial history.   Review of Systems   Review of Systems  Constitutional:  Negative for chills and fever.  HENT:  Negative for ear pain and sore throat.   Eyes:  Negative for pain and visual disturbance.  Respiratory:  Negative for cough and shortness of breath.   Cardiovascular:  Negative for chest pain and palpitations.  Gastrointestinal:  Positive for abdominal pain and diarrhea. Negative for vomiting.  Genitourinary:  Negative for dysuria and hematuria.  Musculoskeletal:  Negative for arthralgias and back pain.  Skin:  Negative for color change and rash.  Neurological:  Negative for seizures and syncope.  All other systems reviewed and are negative.   Physical Exam Updated Vital Signs BP (!) 143/96 (BP Location: Left Arm)   Pulse 75   Temp 98.1 F (36.7 C) (Oral)   Resp 16   Ht 5\' 7"  (1.702 m)   Wt 122.5 kg   SpO2 99%   BMI 42.29 kg/m  Physical Exam Vitals and nursing note reviewed.  Constitutional:      General: He is not in acute distress.    Appearance: He is well-developed.  HENT:     Head: Normocephalic and atraumatic.  Eyes:     Conjunctiva/sclera: Conjunctivae normal.  Cardiovascular:     Rate  and Rhythm: Normal rate and regular rhythm.     Heart sounds: No murmur heard. Pulmonary:     Effort: Pulmonary effort is normal. No respiratory distress.     Breath sounds: Normal breath sounds.  Abdominal:     General: Abdomen is flat. There is no distension.     Palpations: Abdomen is soft.     Tenderness: There is no abdominal tenderness.  Musculoskeletal:        General: No swelling or deformity.     Cervical back: Neck supple.  Skin:    General: Skin is warm and dry.     Capillary Refill: Capillary refill takes less than 2 seconds.  Neurological:     Mental Status: He is alert and oriented to person, place, and time. Mental status is at baseline.  Psychiatric:        Mood and Affect: Mood normal.      ED Course/ Medical Decision Making/ A&P    Procedures Procedures   Medications Ordered in ED Medications - No data to display  Medical Decision Making:    Cory Torres is a 37 y.o. male who presented to the ED today with profuse diarrhea detailed above.     Complete initial physical exam performed, notably the patient  was hemodynamically stable in no acute distress..      Reviewed and confirmed nursing documentation for past medical  history, family history, social history.    Initial Assessment:   With the patient's presentation of 6 days of diarrhea, most likely diagnosis is infectious etiology given duration of symptoms will consider bacterial etiology likely especially given provocative factor being poultry product. Other diagnoses were considered including (but not limited to) IBD, bowel obstruction, ischemic colitis, appendicitis. These are considered less likely due to history of present illness and physical exam findings.   This is most consistent with an acute life/limb threatening illness complicated by underlying chronic conditions.  Initial Plan:  Recommended continuing oral rehydration the patient is participating in Will add in adjuvants of supportive  care including Zofran and loperamide as needed with strict instructions on use Given duration of symptoms we will treat with ciprofloxacin 500 mg twice daily for 5 days.   Disposition:  I have considered need for hospitalization, however, considering all of the above, I believe this patient is stable for discharge at this time.  Patient/family educated about specific return precautions for given chief complaint and symptoms.  Patient/family educated about follow-up with PCP.     Patient/family expressed understanding of return precautions and need for follow-up. Patient spoken to regarding all imaging and laboratory results and appropriate follow up for these results. All education provided in verbal form with additional information in written form. Time was allowed for answering of patient questions. Patient discharged.    Emergency Department Medication Summary:   Medications - No data to display       Clinical Impression:  1. Diarrhea of presumed infectious origin      Discharge   Final Clinical Impression(s) / ED Diagnoses Final diagnoses:  Diarrhea of presumed infectious origin    Rx / DC Orders ED Discharge Orders          Ordered    ciprofloxacin (CIPRO) 500 MG tablet  Every 12 hours        06/29/23 1024    ondansetron (ZOFRAN) 4 MG tablet  Every 6 hours        06/29/23 1024    loperamide (IMODIUM A-D) 2 MG tablet  4 times daily PRN        06/29/23 1024              Glyn Ade, MD 06/30/23 1550

## 2023-06-29 NOTE — ED Notes (Signed)
Pt alert and oriented X 4 at the time of discharge. RR even and unlabored. No acute distress noted. Pt verbalized understanding of discharge instructions as discussed. Pt ambulatory to lobby at time of discharge.

## 2023-06-29 NOTE — ED Triage Notes (Signed)
In for eval of severe diarrhea x6 days with severe intermittent abd pain. Constant nausea. Fatigue. Intermittent slight headache. Pain is worse after eating or drink then he has immediate diarrhea.
